# Patient Record
Sex: Male | Born: 1935 | Race: White | Hispanic: No | Smoking: Former smoker
Health system: Southern US, Community
[De-identification: ages and names within clinical notes are randomized; demographics above are authoritative.]

## PROBLEM LIST (undated history)

## (undated) DIAGNOSIS — I509 Heart failure, unspecified: Secondary | ICD-10-CM

## (undated) DIAGNOSIS — I10 Essential (primary) hypertension: Secondary | ICD-10-CM

## (undated) DIAGNOSIS — N400 Enlarged prostate without lower urinary tract symptoms: Secondary | ICD-10-CM

## (undated) DIAGNOSIS — I2510 Atherosclerotic heart disease of native coronary artery without angina pectoris: Secondary | ICD-10-CM

## (undated) DIAGNOSIS — I219 Acute myocardial infarction, unspecified: Secondary | ICD-10-CM

## (undated) HISTORY — DX: Heart failure, unspecified: I50.9

## (undated) HISTORY — DX: Atherosclerotic heart disease of native coronary artery without angina pectoris: I25.10

## (undated) HISTORY — PX: CARDIAC CATHETERIZATION: SHX172

## (undated) HISTORY — DX: Benign prostatic hyperplasia without lower urinary tract symptoms: N40.0

## (undated) HISTORY — DX: Acute myocardial infarction, unspecified: I21.9

## (undated) HISTORY — PX: VASECTOMY: SHX75

## (undated) HISTORY — DX: Essential (primary) hypertension: I10

---

## 2020-02-28 ENCOUNTER — Ambulatory Visit (INDEPENDENT_AMBULATORY_CARE_PROVIDER_SITE_OTHER): Payer: Medicare Other | Admitting: Cardiology

## 2020-02-28 ENCOUNTER — Other Ambulatory Visit: Payer: Self-pay

## 2020-02-28 ENCOUNTER — Encounter: Payer: Self-pay | Admitting: Cardiology

## 2020-02-28 VITALS — BP 150/70 | HR 61 | Ht 67.0 in | Wt 166.0 lb

## 2020-02-28 DIAGNOSIS — I2510 Atherosclerotic heart disease of native coronary artery without angina pectoris: Secondary | ICD-10-CM

## 2020-02-28 DIAGNOSIS — I10 Essential (primary) hypertension: Secondary | ICD-10-CM

## 2020-02-28 DIAGNOSIS — R072 Precordial pain: Secondary | ICD-10-CM

## 2020-02-28 NOTE — Patient Instructions (Signed)
Medication Instructions:  Your physician recommends that you continue on your current medications as directed. Please refer to the Current Medication list given to you today.  *If you need a refill on your cardiac medications before your next appointment, please call your pharmacy*   Lab Work: None Ordered If you have labs (blood work) drawn today and your tests are completely normal, you will receive your results only by: Marland Kitchen MyChart Message (if you have MyChart) OR . A paper copy in the mail If you have any lab test that is abnormal or we need to change your treatment, we will call you to review the results.   Testing/Procedures:  1.  Your physician has requested that you have an echocardiogram. Echocardiography is a painless test that uses sound waves to create images of your heart. It provides your doctor with information about the size and shape of your heart and how well your heart's chambers and valves are working. This procedure takes approximately one hour. There are no restrictions for this procedure.  2.  [facility] MYOVIEW       Your caregiver has ordered a Stress Test with nuclear imaging. The purpose of this test is to evaluate the blood supply to your heart muscle. This procedure is referred to as a "Non-Invasive Stress Test." This is because other than having an IV started in your vein, nothing is inserted or "invades" your body. Cardiac stress tests are done to find areas of poor blood flow to the heart by determining the extent of coronary artery disease (CAD). Some patients exercise on a treadmill, which naturally increases the blood flow to your heart, while others who are  unable to walk on a treadmill due to physical limitations have a pharmacologic/chemical stress agent called Lexiscan . This medicine will mimic walking on a treadmill by temporarily increasing your coronary blood flow.      PLEASE REPORT TO [facility] MEDICAL MALL ENTRANCE   THE VOLUNTEERS AT THE FIRST DESK WILL  DIRECT YOU WHERE TO GO     *Please note: these test may take anywhere between 2-4 hours to complete       Date of Procedure:_____________________________________   Arrival Time for Procedure:______________________________    PLEASE NOTIFY THE OFFICE AT LEAST 24 HOURS IN ADVANCE IF YOU ARE UNABLE TO KEEP YOUR APPOINTMENT.  [phone]  PLEASE NOTIFY NUCLEAR MEDICINE AT [facility] AT LEAST 24 HOURS IN ADVANCE IF YOU ARE UNABLE TO KEEP YOUR APPOINTMENT. [phone]         How to prepare for your Myoview test:         __XX__:  Hold betablocker(s) night before procedure and morning of procedure:     metoprolol succinate (TOPROL-XL     1. Do not eat or drink after midnight  2. No caffeine for 24 hours prior to test  3. No smoking 24 hours prior to test.  4. Unless instructed otherwise, Take your medication with a small sips of water.          Follow-Up: At [facility], you and your health needs are our priority.  As part of our continuing mission to provide you with exceptional heart care, we have created designated Provider Care Teams.  These Care Teams include your primary Cardiologist (physician) and Advanced Practice Providers (APPs -  Physician Assistants and Nurse Practitioners) who all work together to provide you with the care you need, when you need it.  We recommend signing up for the patient portal called "MyChart".  Sign  up information is provided on this After Visit Summary.  MyChart is used to connect with patients for Virtual Visits (Telemedicine).  Patients are able to view lab/test results, encounter notes, upcoming appointments, etc.  Non-urgent messages can be sent to your provider as well.   To learn more about what you can do with MyChart, go to [email or web address].    Your next appointment:   Follow up after Myoview and Echo   The format for your next appointment:   In Person  Provider:   Debbe Odea, MD   Other Instructions

## 2020-02-28 NOTE — Progress Notes (Signed)
Cardiology Office Note:    Date:  02/28/2020   ID:  Douglas Vance, DOB October 08, 1934, MRN [id number]  PCP:  Almetta Lovely, [facility]  [facility] Cardiologist:  Debbe Odea, MD  [facility] Electrophysiologist:  None   Referring MD: Melonie Florida, FNP   Chief Complaint  Patient presents with  . NEW patient    Chest pain/CAD    History of Present Illness:    Douglas Vance is a 86 y.o. male with a hx of hypertension, MI/CAD (s/p PCI x 9) who presents due to chest pain.  States having a history of MI, first occurrence in 2000.  He was seen in [facility] where stent was placed.  Over the past several years, he has had multiple PCI's, totaling 9.  Last PCI was 3 years ago by former cardiologist.  He moved to the area to be close to daughter after he lost his wife in August 2021.  Over the past couple of weeks, he is noted nonexertional chest pain.  He states going through a lot with the move, losing a loved one, which may contribute to some of his symptoms.  Previously was on Plavix, this was stopped after patient developed nosebleeds.   Past Medical History:  Diagnosis Date  . BPH (benign prostatic hyperplasia)   . Coronary artery disease   . Hypertension   . Myocardial infarction [facility])     Past Surgical History:  Procedure Laterality Date  . CARDIAC CATHETERIZATION    . VASECTOMY      Current Medications: Current Meds  Medication Sig  . amLODipine (NORVASC) 5 MG tablet Take 5 mg by mouth daily.  Marland Kitchen aspirin EC 81 MG tablet Take 81 mg by mouth daily. Swallow whole.  . donepezil (ARICEPT) 5 MG tablet Take 5 mg by mouth at bedtime.  Marland Kitchen lisinopril (ZESTRIL) 10 MG tablet Take 10 mg by mouth daily.  Marland Kitchen lovastatin (MEVACOR) 20 MG tablet Take 20 mg by mouth at bedtime.  . metoprolol succinate (TOPROL-XL) 25 MG 24 hr tablet Take 25 mg by mouth daily.  Marland Kitchen omeprazole (PRILOSEC) 20 MG capsule Take 20 mg by mouth daily.  . tamsulosin (FLOMAX) 0.4 MG CAPS  capsule Take 0.4 mg by mouth daily.  Marland Kitchen VITAMIN D PO Take 2,000 Int'l Units/day by mouth daily.     Allergies:   Cipro [ciprofloxacin hcl]   Social History   Socioeconomic History  . Marital status: Widowed    Spouse name: Not on file  . Number of children: Not on file  . Years of education: Not on file  . Highest education level: Not on file  Occupational History  . Not on file  Tobacco Use  . Smoking status: Former Games developer  . Smokeless tobacco: Never Used  Vaping Use  . Vaping Use: Never used  Substance and Sexual Activity  . Alcohol use: Not Currently  . Drug use: Never  . Sexual activity: Not on file  Other Topics Concern  . Not on file  Social History Narrative  . Not on file   Social Determinants of Health   Financial Resource Strain: Not on file  Food Insecurity: Not on file  Transportation Needs: Not on file  Physical Activity: Not on file  Stress: Not on file  Social Connections: Not on file     Family History: The patient's family history is not on file.  ROS:   Please see the history of present illness.     All other systems reviewed and  are negative.  EKGs/Labs/Other Studies Reviewed:    The following studies were reviewed today:   EKG:  EKG is  ordered today.  The ekg ordered today demonstrates sinus rhythm, left bundle branch block.  Recent Labs: No results found for requested labs within last 8760 hours.  Recent Lipid Panel No results found for: CHOL, TRIG, HDL, CHOLHDL, VLDL, LDLCALC, LDLDIRECT   Risk Assessment/Calculations:     Physical Exam:    VS:  BP (!) 150/70 (BP Location: Right Arm, Patient Position: Sitting, Cuff Size: Normal)   Pulse 61   Ht 5\' 7"  (1.702 m)   Wt 166 lb (75.3 kg)   SpO2 93%   BMI 26.00 kg/m     Wt Readings from Last 3 Encounters:  02/28/20 166 lb (75.3 kg)     GEN:  Well nourished, well developed in no acute distress HEENT: Normal NECK: No JVD; No carotid bruits LYMPHATICS: No  lymphadenopathy CARDIAC: RRR, 2/6 systolic murmur RESPIRATORY:  Clear to auscultation without rales, wheezing or rhonchi  ABDOMEN: Soft, non-tender, non-distended MUSCULOSKELETAL:  No edema; No deformity  SKIN: Warm and dry NEUROLOGIC:  Alert and oriented x 3 PSYCHIATRIC:  Normal affect   ASSESSMENT:    1. Precordial pain   2. CAD in native artery   3. Primary hypertension    PLAN:    In order of problems listed above:  1. Patient with chest pain, history of CAD/PCI.  Get echocardiogram, get Lexiscan Myoview to evaluate presence of ischemia.  Consider Imdur for antianginal benefit if symptoms persist. 2. History of CAD/PCI.  Continue aspirin, lovastatin, Toprol-XL as prescribed. 3. Hypertension, Toprol-XL, lisinopril.  Follow-up after echo and Lexiscan Myoview.    Medication Adjustments/Labs and Tests Ordered: Current medicines are reviewed at length with the patient today.  Concerns regarding medicines are outlined above.  Orders Placed This Encounter  Procedures  . NM Myocar Multi W/Spect W/Wall Motion / EF  . EKG 12-Lead  . ECHOCARDIOGRAM COMPLETE   No orders of the defined types were placed in this encounter.   Patient Instructions   Medication Instructions:  Your physician recommends that you continue on your current medications as directed. Please refer to the Current Medication list given to you today.  *If you need a refill on your cardiac medications before your next appointment, please call your pharmacy*   Lab Work: None Ordered If you have labs (blood work) drawn today and your tests are completely normal, you will receive your results only by: 03/01/20 MyChart Message (if you have MyChart) OR . A paper copy in the mail If you have any lab test that is abnormal or we need to change your treatment, we will call you to review the results.   Testing/Procedures:  1.  Your physician has requested that you have an echocardiogram. Echocardiography is a painless  test that uses sound waves to create images of your heart. It provides your doctor with information about the size and shape of your heart and how well your heart's chambers and valves are working. This procedure takes approximately one hour. There are no restrictions for this procedure.  2.  [facility] MYOVIEW       Your caregiver has ordered a Stress Test with nuclear imaging. The purpose of this test is to evaluate the blood supply to your heart muscle. This procedure is referred to as a "Non-Invasive Stress Test." This is because other than having an IV started in your vein, nothing is inserted or "invades" your body.  Cardiac stress tests are done to find areas of poor blood flow to the heart by determining the extent of coronary artery disease (CAD). Some patients exercise on a treadmill, which naturally increases the blood flow to your heart, while others who are  unable to walk on a treadmill due to physical limitations have a pharmacologic/chemical stress agent called Lexiscan . This medicine will mimic walking on a treadmill by temporarily increasing your coronary blood flow.      PLEASE REPORT TO [facility] MEDICAL MALL ENTRANCE   THE VOLUNTEERS AT THE FIRST DESK WILL DIRECT YOU WHERE TO GO     *Please note: these test may take anywhere between 2-4 hours to complete       Date of Procedure:_____________________________________   Arrival Time for Procedure:______________________________    PLEASE NOTIFY THE OFFICE AT LEAST 24 HOURS IN ADVANCE IF YOU ARE UNABLE TO KEEP YOUR APPOINTMENT.  [phone]  PLEASE NOTIFY NUCLEAR MEDICINE AT [facility] AT LEAST 24 HOURS IN ADVANCE IF YOU ARE UNABLE TO KEEP YOUR APPOINTMENT. [phone]         How to prepare for your Myoview test:         __XX__:  Hold betablocker(s) night before procedure and morning of procedure:     metoprolol succinate (TOPROL-XL     1. Do not eat or drink after midnight  2. No caffeine for 24 hours prior to test  3. No  smoking 24 hours prior to test.  4. Unless instructed otherwise, Take your medication with a small sips of water.          Follow-Up: At [facility], you and your health needs are our priority.  As part of our continuing mission to provide you with exceptional heart care, we have created designated Provider Care Teams.  These Care Teams include your primary Cardiologist (physician) and Advanced Practice Providers (APPs -  Physician Assistants and Nurse Practitioners) who all work together to provide you with the care you need, when you need it.  We recommend signing up for the patient portal called "MyChart".  Sign up information is provided on this After Visit Summary.  MyChart is used to connect with patients for Virtual Visits (Telemedicine).  Patients are able to view lab/test results, encounter notes, upcoming appointments, etc.  Non-urgent messages can be sent to your provider as well.   To learn more about what you can do with MyChart, go to [email or web address].    Your next appointment:   Follow up after Myoview and Echo   The format for your next appointment:   In Person  Provider:   Debbe Odea, MD   Other Instructions      Signed, Debbe Odea, MD  02/28/2020 2:42 PM    [facility]

## 2020-03-13 ENCOUNTER — Other Ambulatory Visit: Payer: Self-pay

## 2020-03-13 ENCOUNTER — Ambulatory Visit (INDEPENDENT_AMBULATORY_CARE_PROVIDER_SITE_OTHER): Payer: Medicare Other

## 2020-03-13 DIAGNOSIS — R072 Precordial pain: Secondary | ICD-10-CM | POA: Diagnosis not present

## 2020-03-13 LAB — ECHOCARDIOGRAM COMPLETE
Area-P 1/2: 2.59 cm2
MV M vel: 4.87 m/s
MV Peak grad: 94.9 mmHg
P 1/2 time: 485 msec
S' Lateral: 4.8 cm
Single Plane A4C EF: 22.1 %

## 2020-03-17 ENCOUNTER — Other Ambulatory Visit: Payer: Self-pay

## 2020-03-17 ENCOUNTER — Encounter
Admission: RE | Admit: 2020-03-17 | Discharge: 2020-03-17 | Disposition: A | Discharge status: Home / Self Care | Payer: Medicare Other | Source: Ambulatory Visit | Attending: Cardiology | Admitting: Cardiology

## 2020-03-17 DIAGNOSIS — R072 Precordial pain: Secondary | ICD-10-CM | POA: Diagnosis present

## 2020-03-17 LAB — NM MYOCAR MULTI W/SPECT W/WALL MOTION / EF
Estimated workload: 1 METS
Exercise duration (min): 0 min
Exercise duration (sec): 0 s
LV dias vol: 224 mL (ref 62–150)
LV sys vol: 133 mL
MPHR: 137 {beats}/min
Peak HR: 74 {beats}/min
Percent HR: 54 %
Rest HR: 52 {beats}/min
SDS: 0
SRS: 8
SSS: 5
TID: 1.14

## 2020-03-17 MED ORDER — TECHNETIUM TC 99M TETROFOSMIN IV KIT
32.1800 | PACK | Freq: Once | INTRAVENOUS | Status: AC | PRN
  Administered 2020-03-17: 32.18 via INTRAVENOUS

## 2020-03-17 MED ORDER — REGADENOSON 0.4 MG/5ML IV SOLN
0.4000 mg | Freq: Once | INTRAVENOUS | Status: AC
  Administered 2020-03-17: 0.4 mg via INTRAVENOUS

## 2020-03-17 MED ORDER — TECHNETIUM TC 99M TETROFOSMIN IV KIT
10.0180 | PACK | Freq: Once | INTRAVENOUS | Status: AC | PRN
  Administered 2020-03-17: 10.018 via INTRAVENOUS

## 2020-03-18 ENCOUNTER — Telehealth: Payer: Self-pay

## 2020-03-18 MED ORDER — ISOSORBIDE MONONITRATE ER 30 MG PO TB24: 30.0000 mg | ORAL_TABLET | Freq: Every day | ORAL | 5 refills | Status: DC

## 2020-03-18 NOTE — Telephone Encounter (Signed)
Called patient and relayed the below result note from Dr. Azucena Cecil and the result note from his Echo Cardio gram as copied and pasted below:  Douglas Odea, MD  Gibson Ramp, RN Patient has moderately reduced ejection fraction/squeezing function of his heart. Continue medications as prescribed. Obtain stress test/Myoview as scheduled. Keep follow-up appointment.   Prescription for IMDUR sent to patients pharmacy. Patient verbalized understanding and confirmed that he would be here for his follow up on Monday 03/27/20 at 10:20.

## 2020-03-18 NOTE — Telephone Encounter (Signed)
-----   Message from Debbe Odea, MD sent at 03/18/2020  9:54 AM EST ----- No evidence of ischemia noted on stress test/Myoview.  Please start Imdur 30 mg daily to help with chest pain.  Keep follow-up appointment.  Thank you

## 2020-03-27 ENCOUNTER — Encounter: Payer: Self-pay | Admitting: Cardiology

## 2020-03-27 ENCOUNTER — Ambulatory Visit (INDEPENDENT_AMBULATORY_CARE_PROVIDER_SITE_OTHER): Payer: Medicare Other | Admitting: Cardiology

## 2020-03-27 ENCOUNTER — Other Ambulatory Visit: Payer: Self-pay

## 2020-03-27 VITALS — BP 118/56 | HR 50 | Ht 67.0 in | Wt 165.0 lb

## 2020-03-27 DIAGNOSIS — I5020 Unspecified systolic (congestive) heart failure: Secondary | ICD-10-CM | POA: Diagnosis not present

## 2020-03-27 DIAGNOSIS — I2510 Atherosclerotic heart disease of native coronary artery without angina pectoris: Secondary | ICD-10-CM | POA: Diagnosis not present

## 2020-03-27 DIAGNOSIS — R072 Precordial pain: Secondary | ICD-10-CM | POA: Diagnosis not present

## 2020-03-27 DIAGNOSIS — I10 Essential (primary) hypertension: Secondary | ICD-10-CM

## 2020-03-27 MED ORDER — METOPROLOL SUCCINATE ER 25 MG PO TB24: 12.5000 mg | ORAL_TABLET | Freq: Every day | ORAL | Status: DC

## 2020-03-27 NOTE — Progress Notes (Signed)
Cardiology Office Note:    Date:  03/27/2020   ID:  Douglas Vance, DOB August 18, 1934, MRN [id number]  PCP:  Housecalls, [facility]  [facility] Cardiologist:  Debbe Odea, MD  [facility] Electrophysiologist:  None   Referring MD: [facility], [facility]*   Chief Complaint  Patient presents with  . Follow-up    After testing--echo and stress test    History of Present Illness:    Douglas Vance is a 86 y.o. male with a hx of hypertension, MI/CAD (s/p PCI x 9) who presents for follow-up.  Patient last seen due to chest pain.  Echocardiogram and Lexiscan Myoview were ordered to evaluate patient's symptoms.  He was started on Imdur 30 mg daily to help with chest pain.  He states symptoms of chest discomfort has improved a little bit.  Also notes unsteadiness in his feet.  Patient presents today with daughter, who states patient has a history of GI bleeds, EGD and colonoscopy only revealed gastritis.  Has a history of significant nosebleeds, causing patient to stop Plavix.  Prior notes States having a history of MI, first occurrence in 2000.  He was seen in [facility] where stent was placed.  Over the past several years, he has had multiple PCI's, totaling 9.  Last PCI was 3 years ago by former cardiologist.  He moved to the area to be close to daughter after he lost his wife in August 2021.  Over the past couple of weeks, he is noted nonexertional chest pain.  He states going through a lot with the move, losing a loved one, which may contribute to some of his symptoms.   Past Medical History:  Diagnosis Date  . BPH (benign prostatic hyperplasia)   . Coronary artery disease   . Hypertension   . Myocardial infarction [facility])     Past Surgical History:  Procedure Laterality Date  . CARDIAC CATHETERIZATION    . VASECTOMY      Current Medications: Current Meds  Medication Sig  . aspirin EC 81 MG tablet Take 81 mg by mouth daily. Swallow whole.  .  donepezil (ARICEPT) 5 MG tablet Take 5 mg by mouth at bedtime.  . isosorbide mononitrate (IMDUR) 30 MG 24 hr tablet Take 1 tablet (30 mg total) by mouth daily.  Marland Kitchen lisinopril (ZESTRIL) 10 MG tablet Take 10 mg by mouth daily.  Marland Kitchen lovastatin (MEVACOR) 20 MG tablet Take 20 mg by mouth at bedtime.  Marland Kitchen omeprazole (PRILOSEC) 20 MG capsule Take 20 mg by mouth daily.  . tamsulosin (FLOMAX) 0.4 MG CAPS capsule Take 0.4 mg by mouth daily.  Marland Kitchen VITAMIN D PO Take 2,000 Int'l Units/day by mouth daily.  . [DISCONTINUED] amLODipine (NORVASC) 5 MG tablet Take 5 mg by mouth daily.  . [DISCONTINUED] metoprolol succinate (TOPROL-XL) 25 MG 24 hr tablet Take 25 mg by mouth daily.     Allergies:   Cipro [ciprofloxacin hcl]   Social History   Socioeconomic History  . Marital status: Widowed    Spouse name: Not on file  . Number of children: Not on file  . Years of education: Not on file  . Highest education level: Not on file  Occupational History  . Not on file  Tobacco Use  . Smoking status: Former Games developer  . Smokeless tobacco: Never Used  Vaping Use  . Vaping Use: Never used  Substance and Sexual Activity  . Alcohol use: Not Currently  . Drug use: Never  . Sexual activity: Not on  file  Other Topics Concern  . Not on file  Social History Narrative  . Not on file   Social Determinants of Health   Financial Resource Strain: Not on file  Food Insecurity: Not on file  Transportation Needs: Not on file  Physical Activity: Not on file  Stress: Not on file  Social Connections: Not on file     Family History: The patient's family history is not on file.  ROS:   Please see the history of present illness.     All other systems reviewed and are negative.  EKGs/Labs/Other Studies Reviewed:    The following studies were reviewed today:   EKG:  EKG is  ordered today.  The ekg ordered today demonstrates sinus rhythm, left bundle branch block.  Recent Labs: No results found for requested labs  within last 8760 hours.  Recent Lipid Panel No results found for: CHOL, TRIG, HDL, CHOLHDL, VLDL, LDLCALC, LDLDIRECT   Risk Assessment/Calculations:     Physical Exam:    VS:  BP (!) 118/56   Pulse (!) 50   Ht 5\' 7"  (1.702 m)   Wt 165 lb (74.8 kg)   BMI 25.84 kg/m     Wt Readings from Last 3 Encounters:  03/27/20 165 lb (74.8 kg)  02/28/20 166 lb (75.3 kg)     GEN:  Well nourished, well developed in no acute distress HEENT: Normal NECK: No JVD; No carotid bruits LYMPHATICS: No lymphadenopathy CARDIAC: RRR, 2/6 systolic murmur RESPIRATORY:  Clear to auscultation without rales, wheezing or rhonchi  ABDOMEN: Soft, non-tender, non-distended MUSCULOSKELETAL:  No edema; No deformity  SKIN: Warm and dry NEUROLOGIC:  Alert and oriented x 3 PSYCHIATRIC:  Normal affect   ASSESSMENT:    1. Precordial pain   2. CAD in native artery   3. Primary hypertension   4. HFrEF (heart failure with reduced ejection fraction) (HCC)    PLAN:    In order of problems listed above:  1. Patient with chest pain, history of CAD/PCI.  Echocardiogram shows moderately reduced systolic function, EF 30 to 35%.  Aortic valve sclerosis present.  Lexiscan Myoview with no evidence for ischemia.  Symptoms improved with Imdur.  Continue Imdur 30 mg daily. 2. History of CAD/PCI x 9.  Prior records from previous cardiologist requested not obtained.  Discussed with patient and daughter in details.  Due to patient being intolerant on DAPT, will not pursue left heart cath as we will not be able to intervene if any significant stenosis is found.  Continue aspirin, lovastatin, Imdur.  As prescribed. 3. Hypertension, BP low, also bradycardic, unsteady gait.  Stop Norvasc, decrease Toprol-XL to 12.5 mg daily.  Consider decreasing lisinopril if BP remains low. 4. HFrEF, likely ischemic in origin.  Toprol-XL, lisinopril.  Patient is euvolemic.  Follow-up in 6 weeks   Medication Adjustments/Labs and Tests  Ordered: Current medicines are reviewed at length with the patient today.  Concerns regarding medicines are outlined above.  No orders of the defined types were placed in this encounter.  Meds ordered this encounter  Medications  . metoprolol succinate (TOPROL-XL) 25 MG 24 hr tablet    Sig: Take 0.5 tablets (12.5 mg total) by mouth daily.    Patient Instructions  Medication Instructions:   Your physician has recommended you make the following change in your medication:   1.  STOP taking your Norvasc (Amlodipine).  2.  DECREASE your Toprol XL (Metoprolol Succinate) to 12.5 MG daily. This will be half your your  25 MG tablet.    Lab Work: None ordered If you have labs (blood work) drawn today and your tests are completely normal, you will receive your results only by: Marland Kitchen MyChart Message (if you have MyChart) OR . A paper copy in the mail If you have any lab test that is abnormal or we need to change your treatment, we will call you to review the results.   Testing/Procedures: None ordered   Follow-Up: At [facility], you and your health needs are our priority.  As part of our continuing mission to provide you with exceptional heart care, we have created designated Provider Care Teams.  These Care Teams include your primary Cardiologist (physician) and Advanced Practice Providers (APPs -  Physician Assistants and Nurse Practitioners) who all work together to provide you with the care you need, when you need it.  We recommend signing up for the patient portal called "MyChart".  Sign up information is provided on this After Visit Summary.  MyChart is used to connect with patients for Virtual Visits (Telemedicine).  Patients are able to view lab/test results, encounter notes, upcoming appointments, etc.  Non-urgent messages can be sent to your provider as well.   To learn more about what you can do with MyChart, go to [email or web address].    Your next appointment:   6  week(s)  The format for your next appointment:   In Person  Provider:   Debbe Odea, MD ONLY   Other Instructions      Signed, Debbe Odea, MD  03/27/2020 12:26 PM     [facility]

## 2020-03-27 NOTE — Patient Instructions (Signed)
Medication Instructions:   Your physician has recommended you make the following change in your medication:   1.  STOP taking your Norvasc (Amlodipine).  2.  DECREASE your Toprol XL (Metoprolol Succinate) to 12.5 MG daily. This will be half your your 25 MG tablet.    Lab Work: None ordered If you have labs (blood work) drawn today and your tests are completely normal, you will receive your results only by: Marland Kitchen MyChart Message (if you have MyChart) OR . A paper copy in the mail If you have any lab test that is abnormal or we need to change your treatment, we will call you to review the results.   Testing/Procedures: None ordered   Follow-Up: At [facility], you and your health needs are our priority.  As part of our continuing mission to provide you with exceptional heart care, we have created designated Provider Care Teams.  These Care Teams include your primary Cardiologist (physician) and Advanced Practice Providers (APPs -  Physician Assistants and Nurse Practitioners) who all work together to provide you with the care you need, when you need it.  We recommend signing up for the patient portal called "MyChart".  Sign up information is provided on this After Visit Summary.  MyChart is used to connect with patients for Virtual Visits (Telemedicine).  Patients are able to view lab/test results, encounter notes, upcoming appointments, etc.  Non-urgent messages can be sent to your provider as well.   To learn more about what you can do with MyChart, go to [email or web address].    Your next appointment:   6 week(s)  The format for your next appointment:   In Person  Provider:   Debbe Odea, MD ONLY   Other Instructions

## 2020-04-04 ENCOUNTER — Encounter: Payer: Self-pay | Admitting: Cardiology

## 2020-04-07 ENCOUNTER — Telehealth: Payer: Self-pay | Admitting: Cardiology

## 2020-04-07 NOTE — Telephone Encounter (Signed)
Pt c/o medication issue:  1. Name of Medication: lisinopril    2. How are you currently taking this medication (dosage and times per day)? 10 mg po q d   3. Are you having a reaction (difficulty breathing--STAT)?  Staggers when standing   4. What is your medication issue?  Previously discussed with [facility] at last visit patient metoprolol was decreased patient continues to stagger when standing and home health nurse told daughter patient has orthostatic hypotension .  Sitting bp 118/50 and standing 100/50 .    Patient daughter wants possible med change addressed asap as she is ordering patient blister packs from [facility] soon .

## 2020-04-09 NOTE — Telephone Encounter (Signed)
Patient's daughter, Gavin Pound, is calling (who is NOT on DPR) to discuss medication change(LIsinopril). Please call patient.

## 2020-04-09 NOTE — Telephone Encounter (Signed)
Spoke with patients daughter and reviewed she is not listed on release form and that we are still pending provider review how to proceed with lisinopril. Advised we would call him with this information once provider has recommendations. She verbalized understanding with no further questions at this time.

## 2020-04-10 NOTE — Telephone Encounter (Signed)
Spoke with patient and reviewed that provider said he could hold the lisinopril since his blood pressures have been running low and he has had problems with ambulation. He verbalized understanding with no further questions at this time.

## 2020-05-12 ENCOUNTER — Encounter: Payer: Self-pay | Admitting: Cardiology

## 2020-05-12 ENCOUNTER — Ambulatory Visit (INDEPENDENT_AMBULATORY_CARE_PROVIDER_SITE_OTHER): Payer: Medicare Other | Admitting: Cardiology

## 2020-05-12 ENCOUNTER — Other Ambulatory Visit: Payer: Self-pay

## 2020-05-12 VITALS — BP 114/50 | HR 59 | Ht 67.0 in | Wt 165.0 lb

## 2020-05-12 DIAGNOSIS — I10 Essential (primary) hypertension: Secondary | ICD-10-CM

## 2020-05-12 DIAGNOSIS — I2510 Atherosclerotic heart disease of native coronary artery without angina pectoris: Secondary | ICD-10-CM | POA: Diagnosis not present

## 2020-05-12 DIAGNOSIS — I5020 Unspecified systolic (congestive) heart failure: Secondary | ICD-10-CM

## 2020-05-12 NOTE — Patient Instructions (Signed)
Medication Instructions:   STOP Lisinopril  *If you need a refill on your cardiac medications before your next appointment, please call your pharmacy*   Follow-Up: At [facility], you and your health needs are our priority.  As part of our continuing mission to provide you with exceptional heart care, we have created designated Provider Care Teams.  These Care Teams include your primary Cardiologist (physician) and Advanced Practice Providers (APPs -  Physician Assistants and Nurse Practitioners) who all work together to provide you with the care you need, when you need it.  We recommend signing up for the patient portal called "MyChart".  Sign up information is provided on this After Visit Summary.  MyChart is used to connect with patients for Virtual Visits (Telemedicine).  Patients are able to view lab/test results, encounter notes, upcoming appointments, etc.  Non-urgent messages can be sent to your provider as well.   To learn more about what you can do with MyChart, go to [email or web address].    Your next appointment:   6 week(s)  The format for your next appointment:   In Person  Provider:   You may see Debbe Odea, MD or one of the following Advanced Practice Providers on your designated Care Team:    Nicolasa Ducking, NP  Eula Listen, PA-C  Marisue Ivan, PA-C  Cadence [place], [place]  Gillian Shields, NP

## 2020-05-12 NOTE — Progress Notes (Signed)
Cardiology Office Note:    Date:  05/12/2020   ID:  Douglas Vance, DOB [birth year], MRN [id number]  PCP:  [facility]  [facility] Cardiologist:  Debbe Odea, MD  [facility] Electrophysiologist:  None   Referring MD: [facility]*   Chief Complaint  Patient presents with  . Other    6 week follow up. Patient c/o still getting dizzy when moving from the sitting to standing position. Med reviewed verbally with patient.     History of Present Illness:    Douglas Vance is a 86 y.o. male with a hx of hypertension, MI/CAD (s/p PCI x 9) who presents for follow-up.  He was last seen for chest pain and low blood pressures.  He was started on Imdur with good effect, chest pain is much improved.  His blood pressures have been low, Norvasc was stopped after last visit.  He still states feeling dizzy when moving from sitting to standing position.  Currently takes lisinopril 10 mg daily, Toprol-XL 12.5 mg daily, Imdur 30 mg daily.   Prior notes States having a history of MI, first occurrence in 2000.  He was seen in [facility] where stent was placed.  Over the past several years, he has had multiple PCI's, totaling 9.  Last PCI was 3 years ago by former cardiologist.  He moved to the area to be close to daughter after he lost his wife in August 2021.  patient has a history of GI bleeds, EGD and colonoscopy only revealed gastritis.  Has a history of significant nosebleeds, causing patient to stop Plavix. .   Past Medical History:  Diagnosis Date  . BPH (benign prostatic hyperplasia)   . Coronary artery disease   . Hypertension   . Myocardial infarction [facility])     Past Surgical History:  Procedure Laterality Date  . CARDIAC CATHETERIZATION    . VASECTOMY      Current Medications: Current Meds  Medication Sig  . aspirin EC 81 MG tablet Take 81 mg by mouth daily. Swallow whole.  . donepezil (ARICEPT) 5 MG tablet Take 5 mg by mouth  at bedtime.  . isosorbide mononitrate (IMDUR) 30 MG 24 hr tablet Take 1 tablet (30 mg total) by mouth daily.  Marland Kitchen levothyroxine (SYNTHROID) 25 MCG tablet Take 25 mcg by mouth every morning.  . lovastatin (MEVACOR) 20 MG tablet Take 20 mg by mouth at bedtime.  . metoprolol succinate (TOPROL-XL) 25 MG 24 hr tablet Take 0.5 tablets (12.5 mg total) by mouth daily.  Marland Kitchen omeprazole (PRILOSEC) 20 MG capsule Take 20 mg by mouth daily.  . sertraline (ZOLOFT) 25 MG tablet Take 25 mg by mouth daily.  . tamsulosin (FLOMAX) 0.4 MG CAPS capsule Take 0.4 mg by mouth daily.  . traZODone (DESYREL) 50 MG tablet Take 25 mg by mouth at bedtime.  Marland Kitchen VITAMIN D PO Take 2,000 Int'l Units/day by mouth daily.     Allergies:   Cipro [ciprofloxacin hcl]   Social History   Socioeconomic History  . Marital status: Widowed    Spouse name: Not on file  . Number of children: Not on file  . Years of education: Not on file  . Highest education level: Not on file  Occupational History  . Not on file  Tobacco Use  . Smoking status: Former Games developer  . Smokeless tobacco: Never Used  Vaping Use  . Vaping Use: Never used  Substance and Sexual Activity  . Alcohol use: Not Currently  .  Drug use: Never  . Sexual activity: Not on file  Other Topics Concern  . Not on file  Social History Narrative  . Not on file   Social Determinants of Health   Financial Resource Strain: Not on file  Food Insecurity: Not on file  Transportation Needs: Not on file  Physical Activity: Not on file  Stress: Not on file  Social Connections: Not on file     Family History: The patient's family history is not on file.  ROS:   Please see the history of present illness.     All other systems reviewed and are negative.  EKGs/Labs/Other Studies Reviewed:    The following studies were reviewed today:   EKG:  EKG id ordered today.  EKG shows sinus bradycardia, left bundle branch block, first-degree AV block.  Recent Labs: No results  found for requested labs within last 8760 hours.  Recent Lipid Panel No results found for: CHOL, TRIG, HDL, CHOLHDL, VLDL, LDLCALC, LDLDIRECT   Risk Assessment/Calculations:     Physical Exam:    VS:  BP (!) 114/50 (BP Location: Left Arm, Patient Position: Sitting, Cuff Size: Normal)   Pulse (!) 59   Ht 5\' 7"  (1.702 m)   Wt 165 lb (74.8 kg)   SpO2 96%   BMI 25.84 kg/m     Wt Readings from Last 3 Encounters:  05/12/20 165 lb (74.8 kg)  03/27/20 165 lb (74.8 kg)  02/28/20 166 lb (75.3 kg)     GEN:  Well nourished, well developed in no acute distress HEENT: Normal NECK: No JVD; No carotid bruits LYMPHATICS: No lymphadenopathy CARDIAC: RRR, 2/6 systolic murmur RESPIRATORY:  Clear to auscultation without rales, wheezing or rhonchi  ABDOMEN: Soft, non-tender, non-distended MUSCULOSKELETAL:  No edema; No deformity  SKIN: Warm and dry NEUROLOGIC:  Alert and oriented x 3 PSYCHIATRIC:  Normal affect   ASSESSMENT:    1. CAD in native artery   2. Primary hypertension   3. HFrEF (heart failure with reduced ejection fraction) (HCC)    PLAN:    In order of problems listed above:  1. History of CAD/PCI x 9.  Prior records from previous cardiologist requested but not obtained.  Previous intolerance to DAPT. repeat Left heart cath not obtained. Continue aspirin, lovastatin, Imdur. 2. Hypertension, BP low, also bradycardic, unsteady gait.  Stop lisinopril, continue Toprol and Imdur for antianginal benefit.  If BP remains low, plan to stop Toprol-XL, and consider decreasing even though. 3. HFrEF, likely ischemic in origin.  Low blood pressures preventing CHF medication use.  He is euvolemic.  Follow-up in 6 weeks   Medication Adjustments/Labs and Tests Ordered: Current medicines are reviewed at length with the patient today.  Concerns regarding medicines are outlined above.  Orders Placed This Encounter  Procedures  . EKG 12-Lead   No orders of the defined types were placed in  this encounter.   Patient Instructions  Medication Instructions:   STOP Lisinopril  *If you need a refill on your cardiac medications before your next appointment, please call your pharmacy*   Follow-Up: At [facility], you and your health needs are our priority.  As part of our continuing mission to provide you with exceptional heart care, we have created designated Provider Care Teams.  These Care Teams include your primary Cardiologist (physician) and Advanced Practice Providers (APPs -  Physician Assistants and Nurse Practitioners) who all work together to provide you with the care you need, when you need it.  We recommend  signing up for the patient portal called "MyChart".  Sign up information is provided on this After Visit Summary.  MyChart is used to connect with patients for Virtual Visits (Telemedicine).  Patients are able to view lab/test results, encounter notes, upcoming appointments, etc.  Non-urgent messages can be sent to your provider as well.   To learn more about what you can do with MyChart, go to [email or web address].    Your next appointment:   6 week(s)  The format for your next appointment:   In Person  Provider:   You may see Debbe Odea, MD or one of the following Advanced Practice Providers on your designated Care Team:    Nicolasa Ducking, NP  Eula Listen, PA-C  Marisue Ivan, PA-C  Cadence [place], [place]  Gillian Shields, NP      Signed, Debbe Odea, MD  05/12/2020 12:28 PM    [facility]

## 2020-06-06 ENCOUNTER — Inpatient Hospital Stay: Payer: Medicare Other

## 2020-06-06 ENCOUNTER — Emergency Department: Payer: Medicare Other

## 2020-06-06 ENCOUNTER — Inpatient Hospital Stay
Admission: EM | Admit: 2020-06-06 | Discharge: 2020-06-11 | DRG: 177 | Disposition: A | Discharge status: Home Health | Payer: Medicare Other | Attending: Internal Medicine | Admitting: Internal Medicine

## 2020-06-06 DIAGNOSIS — I2510 Atherosclerotic heart disease of native coronary artery without angina pectoris: Secondary | ICD-10-CM

## 2020-06-06 DIAGNOSIS — Z79890 Hormone replacement therapy (postmenopausal): Secondary | ICD-10-CM | POA: Diagnosis not present

## 2020-06-06 DIAGNOSIS — J690 Pneumonitis due to inhalation of food and vomit: Secondary | ICD-10-CM | POA: Diagnosis present

## 2020-06-06 DIAGNOSIS — J440 Chronic obstructive pulmonary disease with acute lower respiratory infection: Secondary | ICD-10-CM | POA: Diagnosis present

## 2020-06-06 DIAGNOSIS — I255 Ischemic cardiomyopathy: Secondary | ICD-10-CM | POA: Diagnosis present

## 2020-06-06 DIAGNOSIS — J9601 Acute respiratory failure with hypoxia: Secondary | ICD-10-CM | POA: Diagnosis present

## 2020-06-06 DIAGNOSIS — J432 Centrilobular emphysema: Secondary | ICD-10-CM | POA: Diagnosis not present

## 2020-06-06 DIAGNOSIS — Z79899 Other long term (current) drug therapy: Secondary | ICD-10-CM

## 2020-06-06 DIAGNOSIS — E440 Moderate protein-calorie malnutrition: Secondary | ICD-10-CM | POA: Insufficient documentation

## 2020-06-06 DIAGNOSIS — J9602 Acute respiratory failure with hypercapnia: Secondary | ICD-10-CM | POA: Diagnosis present

## 2020-06-06 DIAGNOSIS — I248 Other forms of acute ischemic heart disease: Secondary | ICD-10-CM | POA: Diagnosis present

## 2020-06-06 DIAGNOSIS — R682 Dry mouth, unspecified: Secondary | ICD-10-CM | POA: Diagnosis present

## 2020-06-06 DIAGNOSIS — Z66 Do not resuscitate: Secondary | ICD-10-CM | POA: Diagnosis present

## 2020-06-06 DIAGNOSIS — N400 Enlarged prostate without lower urinary tract symptoms: Secondary | ICD-10-CM | POA: Diagnosis present

## 2020-06-06 DIAGNOSIS — Z9861 Coronary angioplasty status: Secondary | ICD-10-CM

## 2020-06-06 DIAGNOSIS — E785 Hyperlipidemia, unspecified: Secondary | ICD-10-CM | POA: Diagnosis present

## 2020-06-06 DIAGNOSIS — I509 Heart failure, unspecified: Secondary | ICD-10-CM | POA: Diagnosis not present

## 2020-06-06 DIAGNOSIS — Z87891 Personal history of nicotine dependence: Secondary | ICD-10-CM

## 2020-06-06 DIAGNOSIS — I25118 Atherosclerotic heart disease of native coronary artery with other forms of angina pectoris: Secondary | ICD-10-CM | POA: Diagnosis present

## 2020-06-06 DIAGNOSIS — U071 COVID-19: Secondary | ICD-10-CM | POA: Diagnosis present

## 2020-06-06 DIAGNOSIS — I110 Hypertensive heart disease with heart failure: Secondary | ICD-10-CM | POA: Diagnosis present

## 2020-06-06 DIAGNOSIS — I5023 Acute on chronic systolic (congestive) heart failure: Secondary | ICD-10-CM | POA: Diagnosis present

## 2020-06-06 DIAGNOSIS — R778 Other specified abnormalities of plasma proteins: Secondary | ICD-10-CM | POA: Diagnosis not present

## 2020-06-06 DIAGNOSIS — E872 Acidosis, unspecified: Secondary | ICD-10-CM

## 2020-06-06 DIAGNOSIS — J1282 Pneumonia due to coronavirus disease 2019: Secondary | ICD-10-CM | POA: Diagnosis present

## 2020-06-06 DIAGNOSIS — Z8719 Personal history of other diseases of the digestive system: Secondary | ICD-10-CM

## 2020-06-06 DIAGNOSIS — I10 Essential (primary) hypertension: Secondary | ICD-10-CM

## 2020-06-06 DIAGNOSIS — R222 Localized swelling, mass and lump, trunk: Secondary | ICD-10-CM

## 2020-06-06 DIAGNOSIS — I252 Old myocardial infarction: Secondary | ICD-10-CM

## 2020-06-06 DIAGNOSIS — Z23 Encounter for immunization: Secondary | ICD-10-CM | POA: Diagnosis present

## 2020-06-06 DIAGNOSIS — Z888 Allergy status to other drugs, medicaments and biological substances status: Secondary | ICD-10-CM

## 2020-06-06 DIAGNOSIS — E876 Hypokalemia: Secondary | ICD-10-CM | POA: Diagnosis present

## 2020-06-06 DIAGNOSIS — Z7982 Long term (current) use of aspirin: Secondary | ICD-10-CM | POA: Diagnosis not present

## 2020-06-06 DIAGNOSIS — R7989 Other specified abnormal findings of blood chemistry: Secondary | ICD-10-CM

## 2020-06-06 LAB — URINALYSIS, COMPLETE (UACMP) WITH MICROSCOPIC
Bacteria, UA: NONE SEEN
Bilirubin Urine: NEGATIVE
Glucose, UA: NEGATIVE mg/dL
Ketones, ur: NEGATIVE mg/dL
Leukocytes,Ua: NEGATIVE
Nitrite: NEGATIVE
Protein, ur: NEGATIVE mg/dL
Specific Gravity, Urine: 1.008 (ref 1.005–1.030)
pH: 5 (ref 5.0–8.0)

## 2020-06-06 LAB — CBC WITH DIFFERENTIAL/PLATELET
Abs Immature Granulocytes: 0.06 10*3/uL (ref 0.00–0.07)
Basophils Absolute: 0.1 10*3/uL (ref 0.0–0.1)
Basophils Relative: 1 %
Eosinophils Absolute: 0.5 10*3/uL (ref 0.0–0.5)
Eosinophils Relative: 5 %
HCT: 46.8 % (ref 39.0–52.0)
Hemoglobin: 15.2 g/dL (ref 13.0–17.0)
Immature Granulocytes: 1 %
Lymphocytes Relative: 30 %
Lymphs Abs: 3.6 10*3/uL (ref 0.7–4.0)
MCH: 30.3 pg (ref 26.0–34.0)
MCHC: 32.5 g/dL (ref 30.0–36.0)
MCV: 93.4 fL (ref 80.0–100.0)
Monocytes Absolute: 1 10*3/uL (ref 0.1–1.0)
Monocytes Relative: 8 %
Neutro Abs: 6.6 10*3/uL (ref 1.7–7.7)
Neutrophils Relative %: 55 %
Platelets: 240 10*3/uL (ref 150–400)
RBC: 5.01 MIL/uL (ref 4.22–5.81)
RDW: 12.7 % (ref 11.5–15.5)
WBC: 11.8 10*3/uL — ABNORMAL HIGH (ref 4.0–10.5)
nRBC: 0 % (ref 0.0–0.2)

## 2020-06-06 LAB — TROPONIN I (HIGH SENSITIVITY)
Troponin I (High Sensitivity): 19 ng/L — ABNORMAL HIGH (ref <18)
Troponin I (High Sensitivity): 105 ng/L (ref <18)
Troponin I (High Sensitivity): 238 ng/L (ref <18)
Troponin I (High Sensitivity): 258 ng/L (ref <18)

## 2020-06-06 LAB — COMPREHENSIVE METABOLIC PANEL
ALT: 12 U/L (ref 0–44)
AST: 16 U/L (ref 15–41)
Albumin: 4.1 g/dL (ref 3.5–5.0)
Alkaline Phosphatase: 71 U/L (ref 38–126)
Anion gap: 7 (ref 5–15)
BUN: 11 mg/dL (ref 8–23)
CO2: 26 mmol/L (ref 22–32)
Calcium: 8.9 mg/dL (ref 8.9–10.3)
Chloride: 106 mmol/L (ref 98–111)
Creatinine, Ser: 1 mg/dL (ref 0.61–1.24)
GFR, Estimated: >60 mL/min (ref >60)
Glucose, Bld: 132 mg/dL — ABNORMAL HIGH (ref 70–99)
Potassium: 3.6 mmol/L (ref 3.5–5.1)
Sodium: 139 mmol/L (ref 135–145)
Total Bilirubin: 0.8 mg/dL (ref 0.3–1.2)
Total Protein: 8.1 g/dL (ref 6.5–8.1)

## 2020-06-06 LAB — BLOOD GAS, VENOUS
Acid-base deficit: 1.1 mmol/L (ref 0.0–2.0)
Bicarbonate: 29.8 mmol/L — ABNORMAL HIGH (ref 20.0–28.0)
FIO2: 100
O2 Saturation: 53.5 %
Patient temperature: 37
pCO2, Ven: 78 mmHg (ref 44.0–60.0)
pH, Ven: 7.19 — CL (ref 7.250–7.430)
pO2, Ven: 36 mmHg (ref 32.0–45.0)

## 2020-06-06 LAB — PROCALCITONIN: Procalcitonin: <0.1 ng/mL

## 2020-06-06 LAB — MRSA PCR SCREENING: MRSA by PCR: NEGATIVE

## 2020-06-06 LAB — LIPID PANEL
Cholesterol: 130 mg/dL (ref 0–200)
HDL: 56 mg/dL (ref >40)
LDL Cholesterol: 63 mg/dL (ref 0–99)
Total CHOL/HDL Ratio: 2.3 RATIO
Triglycerides: 54 mg/dL (ref <150)
VLDL: 11 mg/dL (ref 0–40)

## 2020-06-06 LAB — PROTIME-INR
INR: 1 (ref 0.8–1.2)
Prothrombin Time: 12.7 seconds (ref 11.4–15.2)

## 2020-06-06 LAB — RESP PANEL BY RT-PCR (FLU A&B, COVID) ARPGX2
Influenza A by PCR: NEGATIVE
Influenza B by PCR: NEGATIVE
SARS Coronavirus 2 by RT PCR: POSITIVE — AB

## 2020-06-06 LAB — D-DIMER, QUANTITATIVE: D-Dimer, Quant: 2.1 ug/mL-FEU — ABNORMAL HIGH (ref 0.00–0.50)

## 2020-06-06 LAB — LACTIC ACID, PLASMA
Lactic Acid, Venous: 2.4 mmol/L (ref 0.5–1.9)
Lactic Acid, Venous: 1.5 mmol/L (ref 0.5–1.9)

## 2020-06-06 LAB — FERRITIN: Ferritin: 29 ng/mL (ref 24–336)

## 2020-06-06 LAB — C-REACTIVE PROTEIN: CRP: 0.6 mg/dL (ref <1.0)

## 2020-06-06 LAB — APTT: aPTT: 43 seconds — ABNORMAL HIGH (ref 24–36)

## 2020-06-06 LAB — BRAIN NATRIURETIC PEPTIDE: B Natriuretic Peptide: 1222.8 pg/mL — ABNORMAL HIGH (ref 0.0–100.0)

## 2020-06-06 IMAGING — DX DG CHEST 1V PORT
2 series · 2 of 2 positions shown · non-contrast
Comparison: None.

CLINICAL DATA: Shortness of breath and decreased oxygenation. Two
week history of shortness of breath worsening tonight. Vomiting
tonight.

EXAM:
PORTABLE CHEST 1 VIEW

[chest ap (1 of 2)]
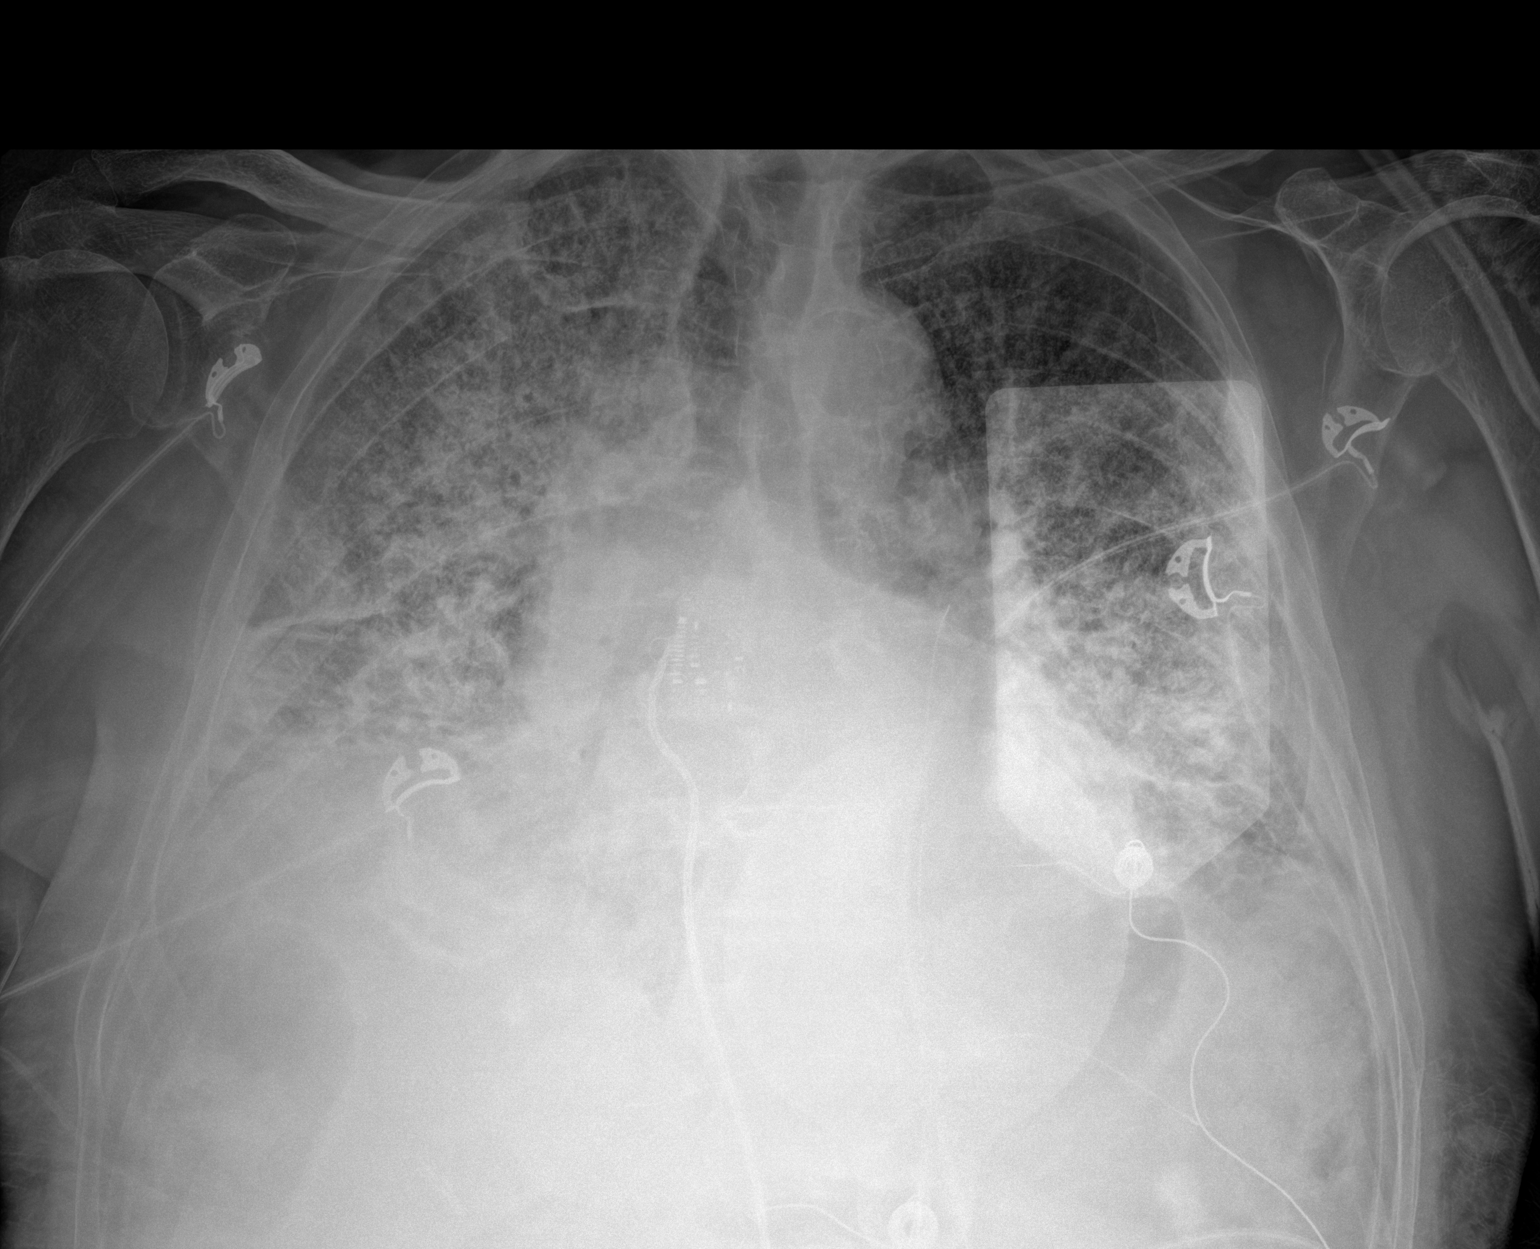

[chest ap (2 of 2)]
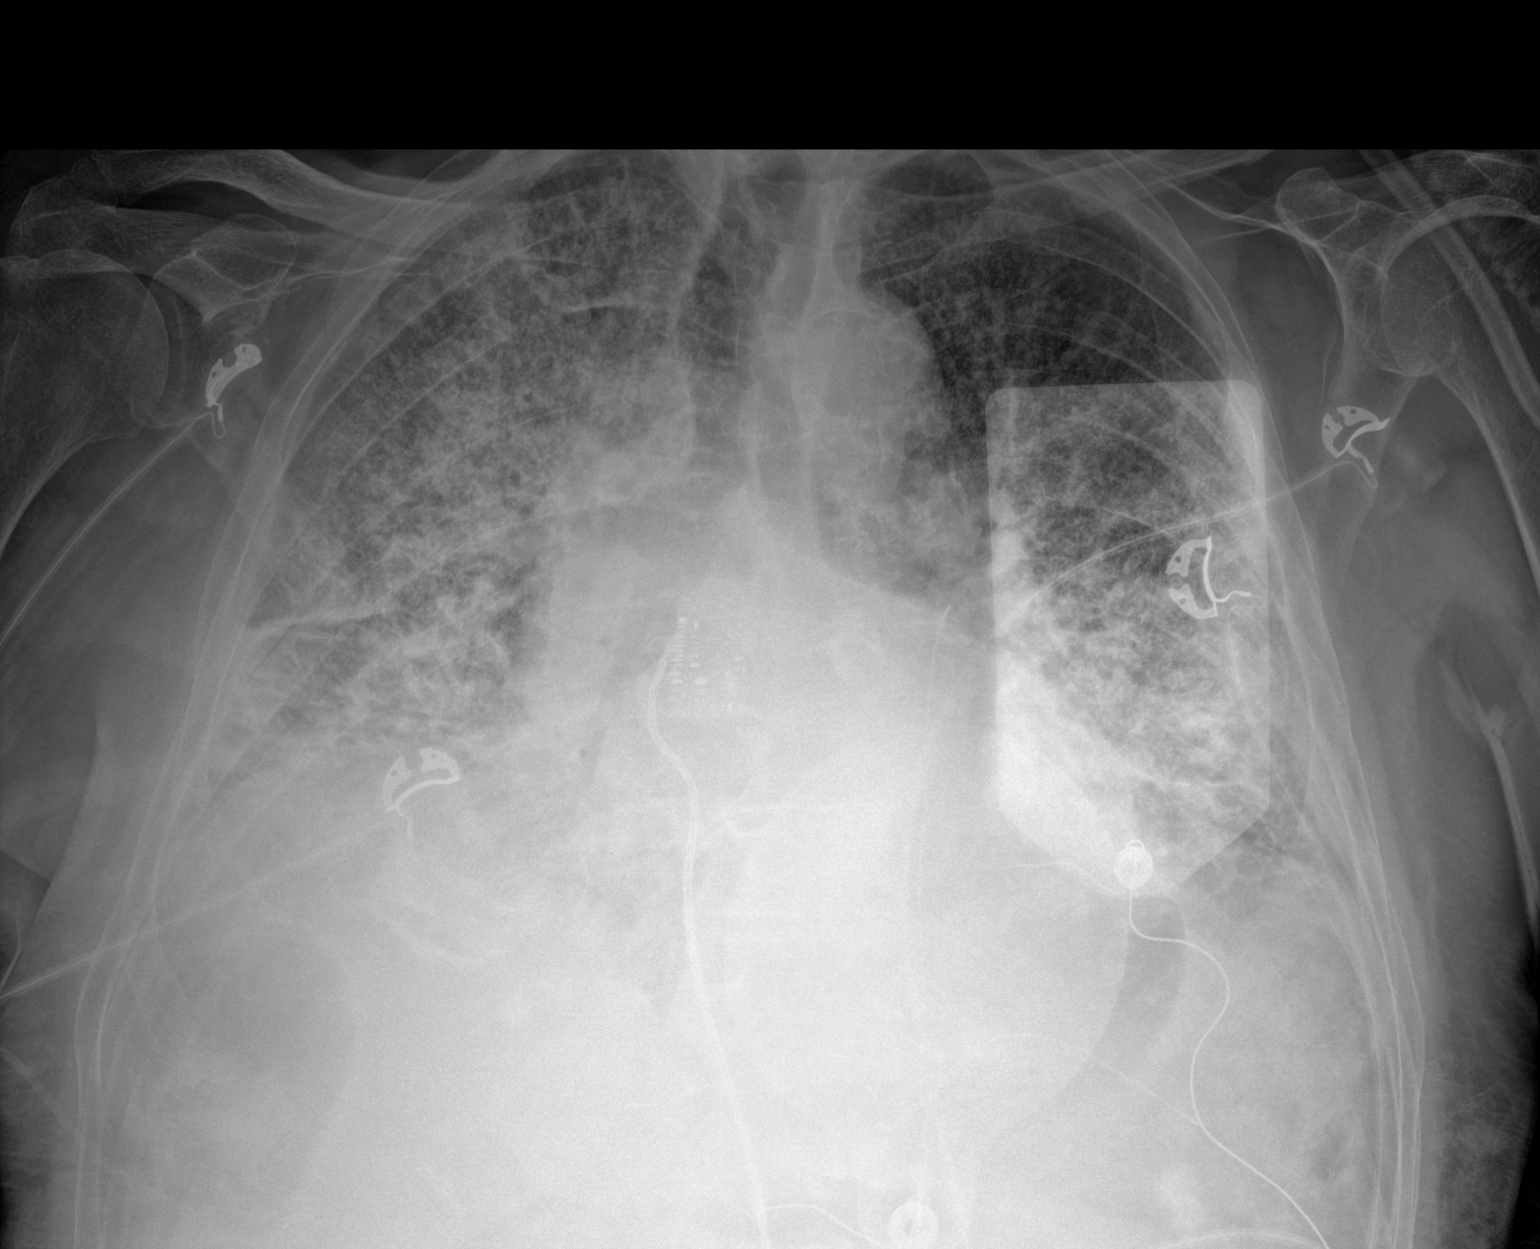

[2 of 2 positions shown; findings below may reference images not displayed]

FINDINGS: Shallow inspiration. Heart size appears enlarged. Diffuse bilateral
airspace disease in the lungs more prominent on the right.
Suggestion of small pleural effusions. Changes may represent edema,
aspiration, or multifocal pneumonia.
IMPRESSION: Cardiac enlargement with diffuse bilateral airspace disease and
small pleural effusions.

## 2020-06-06 IMAGING — CT CT ANGIO CHEST
2 of 6 series · 17 of 46 positions shown · IV contrast (APPLIED)
Comparison: None.

CLINICAL DATA: Shortness of breath

EXAM:
CT ANGIOGRAPHY CHEST WITH CONTRAST
TECHNIQUE: Multidetector CT imaging of the chest was performed using the
standard protocol during bolus administration of intravenous
contrast. Multiplanar CT image reconstructions and MIPs were
obtained to evaluate the vascular anatomy.
CONTRAST:  100mL OMNIPAQUE IOHEXOL 350 MG/ML SOLN

[Series 5: thins · axial · 0.74mm/px · z∈[-258,-27]mm · 15 of 254 slices shown]
[im 12/254  lung]
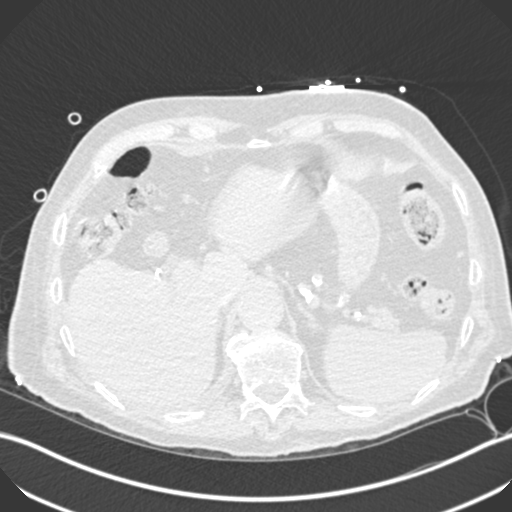
[im 34/254  soft-tissue]
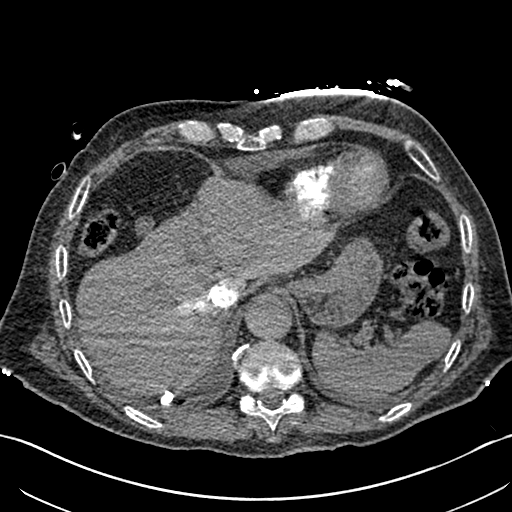
[im 45/254  lung]
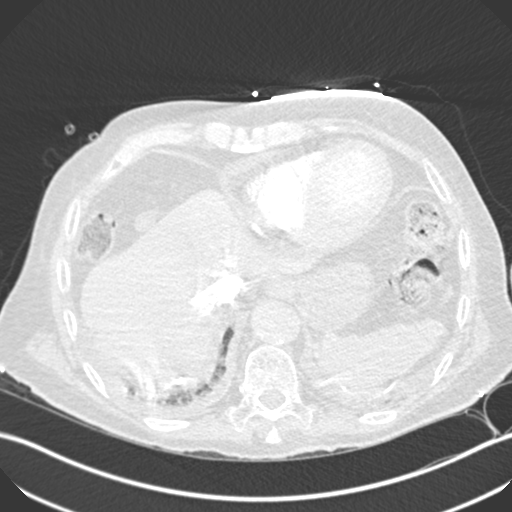
[im 67/254  soft-tissue]
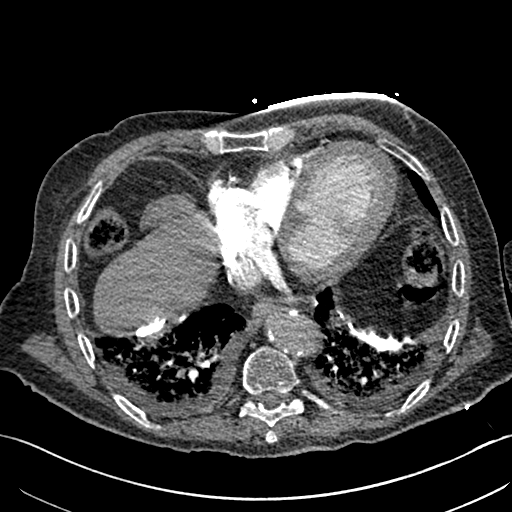
[im 78/254  lung]
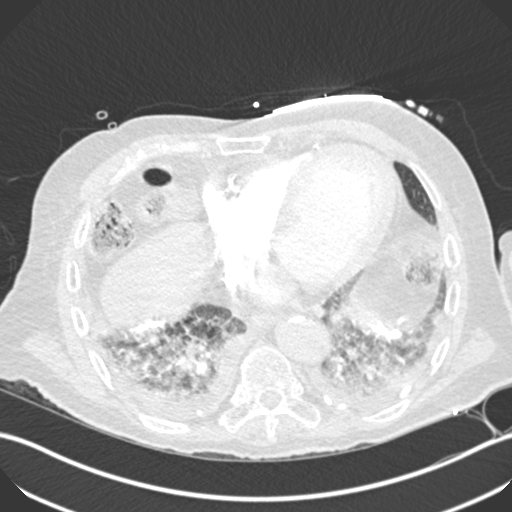
[im 100/254  soft-tissue]
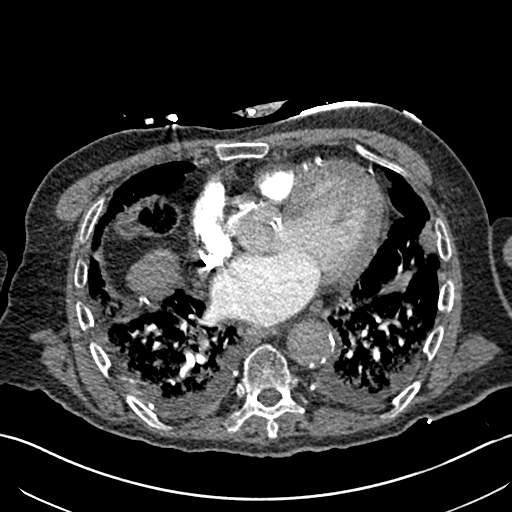
[im 111/254  lung]
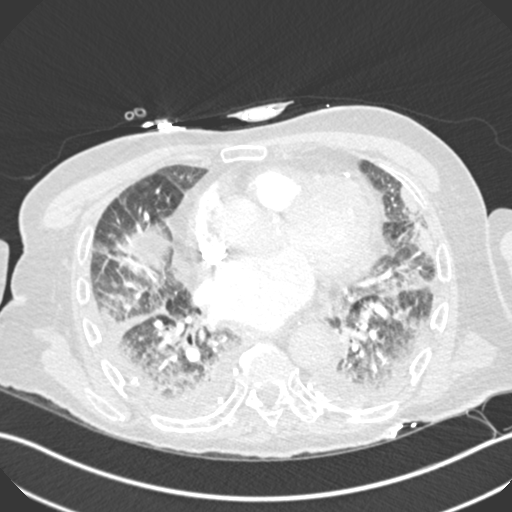
[im 133/254  soft-tissue]
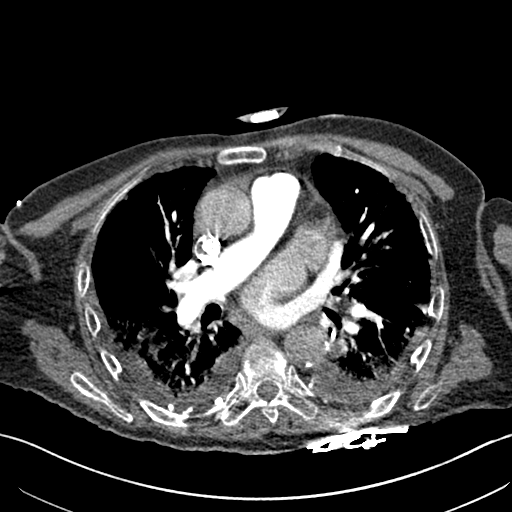
[im 144/254  lung]
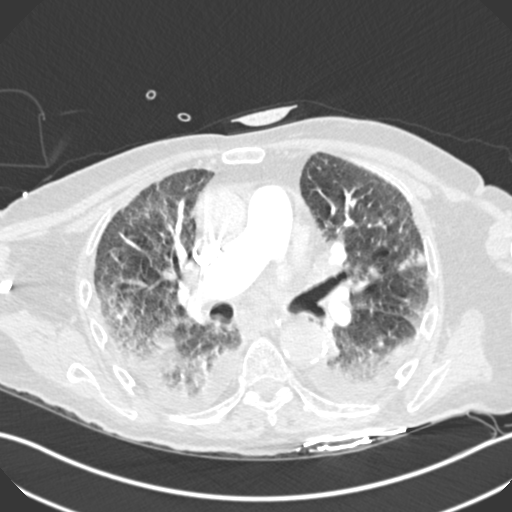
[im 155/254  soft-tissue]
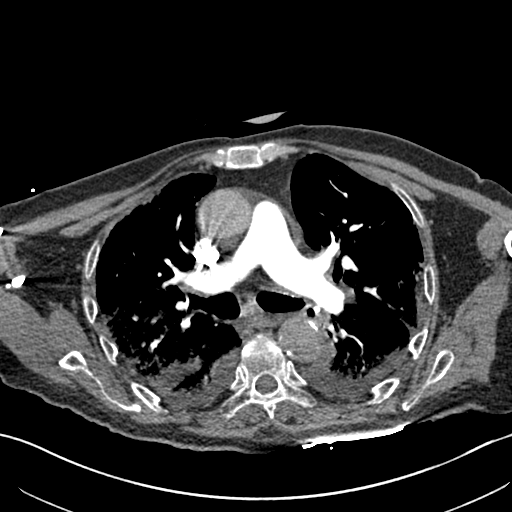
[im 177/254  lung]
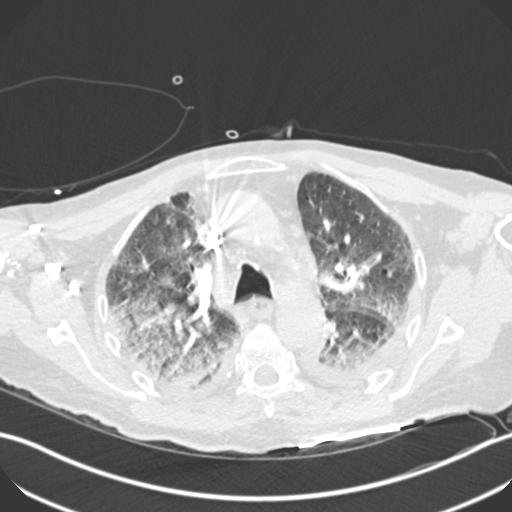
[im 188/254  soft-tissue]
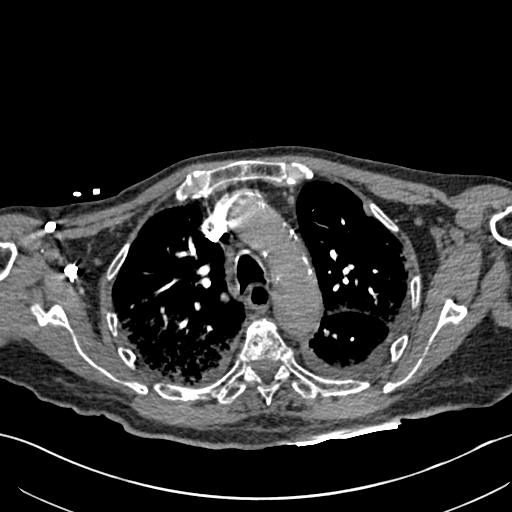
[im 210/254  lung]
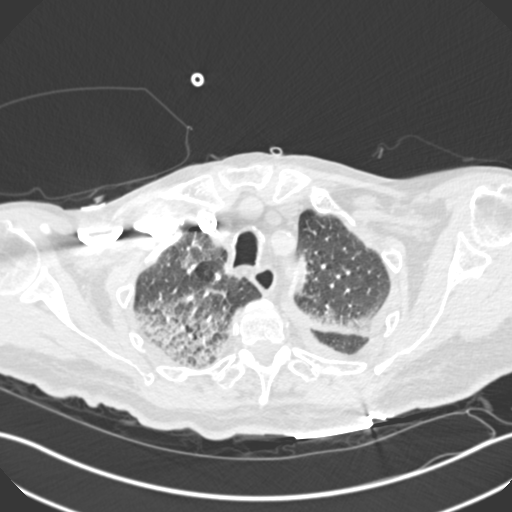
[im 221/254  soft-tissue]
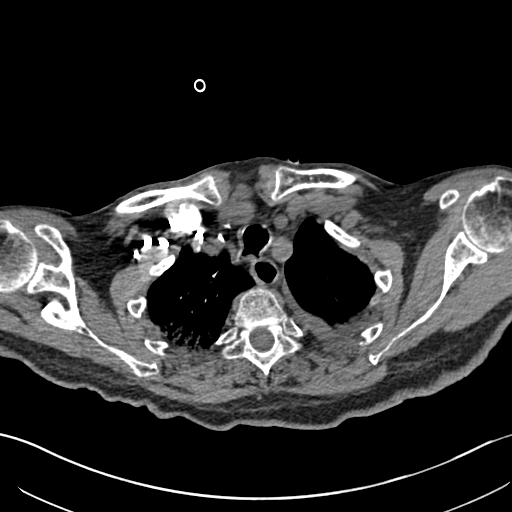
[im 243/254  lung]
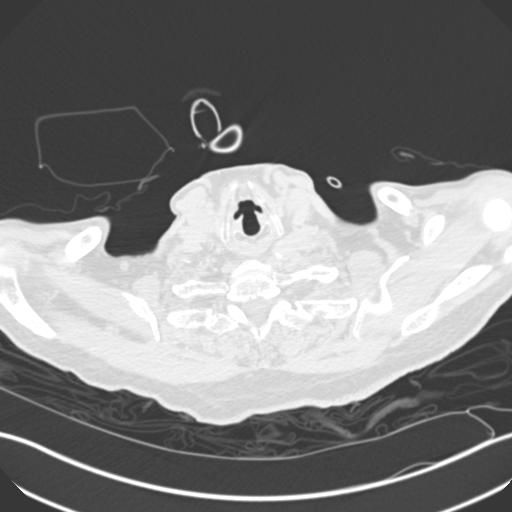

[Series 7: coronal mpr · coronal · 0.50mm/px · 2 of 84 slices shown]
[im 28/84  soft-tissue]
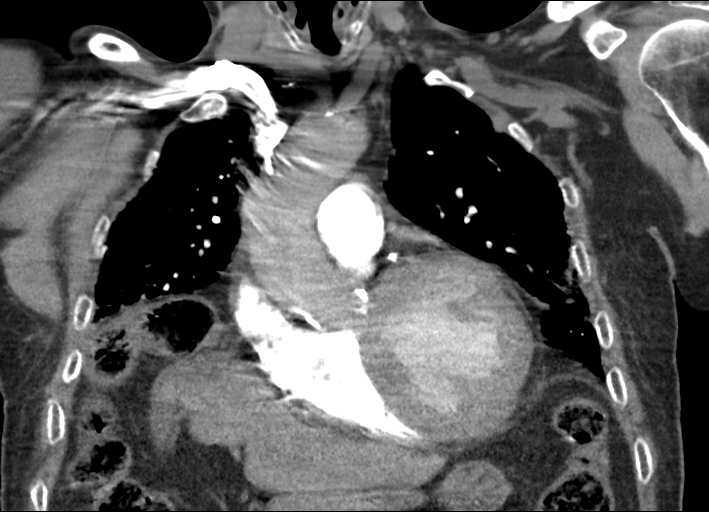
[im 56/84  soft-tissue]
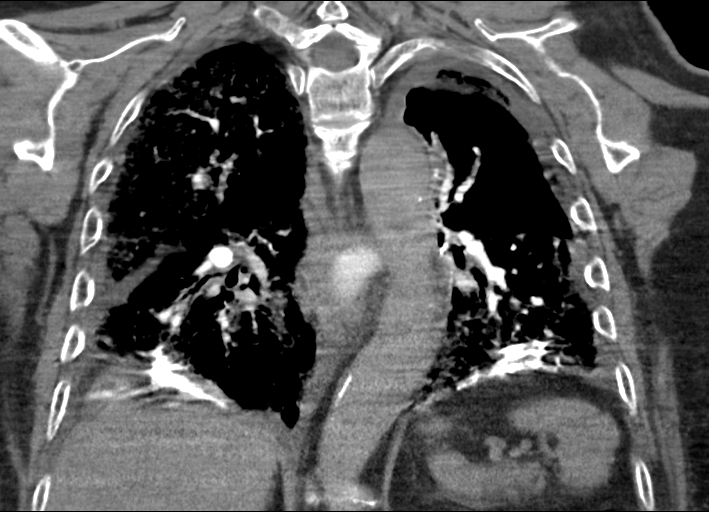

[17 of 46 positions shown; findings below may reference images not displayed]

FINDINGS: Cardiovascular: Satisfactory opacification of the pulmonary arteries
to the segmental level. No evidence of pulmonary embolism. Mild
cardiomegaly. No pericardial effusion. Coronary artery
atherosclerosis. Thoracic aortic atherosclerosis.

Mediastinum/Nodes: No enlarged mediastinal, hilar, or axillary lymph
nodes. Thyroid gland, trachea, and esophagus demonstrate no
significant findings.

Lungs/Pleura: Small bilateral pleural effusions. Bilateral calcified
pleural plaques as can be seen with prior asbestos exposure or
infection. Nodular soft tissue pleural masses along the left lateral
chest wall measuring 2.4 x 1.2 cm 0.7 x 1.8 cm respectively.
Bilateral lower lobe, right middle lobe and bilateral upper lobe
interstitial and alveolar airspace disease most concerning for
multilobar pneumonia including atypical viral pneumonia such as
COVID 19. No pneumothorax.

Upper Abdomen: No acute abnormality.

Musculoskeletal: No acute osseous abnormality. No aggressive osseous
lesion. Age-indeterminate T4 vertebral body compression fracture.

Review of the MIP images confirms the above findings.
IMPRESSION: 1. No evidence of pulmonary embolus.
2. Bilateral lower lobe, right middle lobe and bilateral upper lobe
interstitial and alveolar airspace disease most concerning for
multilobar pneumonia including atypical viral pneumonia such as
COVID 19.
3. Small bilateral pleural effusions.
4. Bilateral calcified pleural plaques as can be seen with prior
asbestos exposure or infection. Nodular soft tissue pleural masses
along the left lateral chest wall measuring 2.4 x 1.2 cm 0.7 x
cm respectively which may reflect focal scarring versus malignancy.
Recommend follow-up CT of the chest with intravenous contrast
following the patient's acute illness.
5. Age-indeterminate T4 vertebral body compression fracture.
6. Aortic Atherosclerosis ([id number]).

## 2020-06-06 MED ORDER — LACTATED RINGERS IV BOLUS (SEPSIS)
1000.0000 mL | Freq: Once | INTRAVENOUS | Status: AC
  Administered 2020-06-06: 1000 mL via INTRAVENOUS

## 2020-06-06 MED ORDER — HYDROCOD POLST-CPM POLST ER 10-8 MG/5ML PO SUER: 5.0000 mL | Freq: Two times a day (BID) | ORAL | Status: DC | PRN

## 2020-06-06 MED ORDER — ATORVASTATIN CALCIUM 20 MG PO TABS
40.0000 mg | ORAL_TABLET | Freq: Every day | ORAL | Status: DC
  Administered 2020-06-06 – 2020-06-11 (×6): 40 mg via ORAL
  Filled 2020-06-06 (×6): qty 2

## 2020-06-06 MED ORDER — METOPROLOL SUCCINATE ER 25 MG PO TB24
12.5000 mg | ORAL_TABLET | Freq: Every day | ORAL | Status: DC
  Filled 2020-06-06 (×3): qty 0.5

## 2020-06-06 MED ORDER — ONDANSETRON HCL 4 MG/2ML IJ SOLN: 4.0000 mg | Freq: Four times a day (QID) | INTRAMUSCULAR | Status: DC | PRN

## 2020-06-06 MED ORDER — SODIUM CHLORIDE 0.9% FLUSH: 3.0000 mL | INTRAVENOUS | Status: DC | PRN

## 2020-06-06 MED ORDER — ALBUTEROL SULFATE HFA 108 (90 BASE) MCG/ACT IN AERS
2.0000 | INHALATION_SPRAY | Freq: Four times a day (QID) | RESPIRATORY_TRACT | Status: DC
  Administered 2020-06-06 – 2020-06-11 (×19): 2 via RESPIRATORY_TRACT
  Filled 2020-06-06: qty 6.7

## 2020-06-06 MED ORDER — DEXAMETHASONE SODIUM PHOSPHATE 10 MG/ML IJ SOLN
6.0000 mg | INTRAMUSCULAR | Status: DC
  Administered 2020-06-06 – 2020-06-11 (×6): 6 mg via INTRAVENOUS
  Filled 2020-06-06 (×3): qty 0.6
  Filled 2020-06-06: qty 1
  Filled 2020-06-06 (×2): qty 0.6

## 2020-06-06 MED ORDER — SODIUM CHLORIDE 0.9 % IV SOLN
100.0000 mg | Freq: Every day | INTRAVENOUS | Status: AC
  Administered 2020-06-07 – 2020-06-10 (×4): 100 mg via INTRAVENOUS
  Filled 2020-06-06 (×4): qty 100

## 2020-06-06 MED ORDER — ISOSORBIDE MONONITRATE ER 30 MG PO TB24
30.0000 mg | ORAL_TABLET | Freq: Every day | ORAL | Status: DC
  Filled 2020-06-06 (×2): qty 1

## 2020-06-06 MED ORDER — SODIUM CHLORIDE 0.9 % IV SOLN: 200.0000 mg | Freq: Once | INTRAVENOUS | Status: DC

## 2020-06-06 MED ORDER — GUAIFENESIN-DM 100-10 MG/5ML PO SYRP: 10.0000 mL | ORAL_SOLUTION | ORAL | Status: DC | PRN

## 2020-06-06 MED ORDER — VANCOMYCIN HCL 1750 MG/350ML IV SOLN
1750.0000 mg | Freq: Once | INTRAVENOUS | Status: AC
  Administered 2020-06-06: 1750 mg via INTRAVENOUS
  Filled 2020-06-06: qty 350

## 2020-06-06 MED ORDER — VANCOMYCIN HCL IN DEXTROSE 1-5 GM/200ML-% IV SOLN
1000.0000 mg | Freq: Once | INTRAVENOUS | Status: DC
  Filled 2020-06-06: qty 200

## 2020-06-06 MED ORDER — ALPRAZOLAM 0.25 MG PO TABS: 0.2500 mg | ORAL_TABLET | Freq: Two times a day (BID) | ORAL | Status: DC | PRN

## 2020-06-06 MED ORDER — SODIUM CHLORIDE 0.9 % IV SOLN: 100.0000 mg | Freq: Every day | INTRAVENOUS | Status: DC

## 2020-06-06 MED ORDER — SODIUM CHLORIDE 0.9 % IV SOLN: 250.0000 mL | INTRAVENOUS | Status: DC | PRN

## 2020-06-06 MED ORDER — ACETAMINOPHEN 325 MG PO TABS: 650.0000 mg | ORAL_TABLET | ORAL | Status: DC | PRN

## 2020-06-06 MED ORDER — SODIUM CHLORIDE 0.9% FLUSH
3.0000 mL | Freq: Two times a day (BID) | INTRAVENOUS | Status: DC
  Administered 2020-06-06 – 2020-06-11 (×11): 3 mL via INTRAVENOUS

## 2020-06-06 MED ORDER — SODIUM CHLORIDE 0.9 % IV SOLN
2.0000 g | Freq: Once | INTRAVENOUS | Status: AC
  Administered 2020-06-06: 2 g via INTRAVENOUS
  Filled 2020-06-06: qty 2

## 2020-06-06 MED ORDER — ASPIRIN EC 81 MG PO TBEC
81.0000 mg | DELAYED_RELEASE_TABLET | Freq: Every day | ORAL | Status: DC
  Administered 2020-06-06 – 2020-06-11 (×6): 81 mg via ORAL
  Filled 2020-06-06 (×6): qty 1

## 2020-06-06 MED ORDER — SODIUM CHLORIDE 0.9 % IV SOLN
3.0000 g | Freq: Four times a day (QID) | INTRAVENOUS | Status: DC
  Administered 2020-06-06 – 2020-06-08 (×7): 3 g via INTRAVENOUS
  Filled 2020-06-06 (×2): qty 3
  Filled 2020-06-06: qty 8
  Filled 2020-06-06: qty 3
  Filled 2020-06-06 (×3): qty 8
  Filled 2020-06-06 (×2): qty 3
  Filled 2020-06-06: qty 8

## 2020-06-06 MED ORDER — FUROSEMIDE 10 MG/ML IJ SOLN
40.0000 mg | Freq: Two times a day (BID) | INTRAMUSCULAR | Status: DC
  Administered 2020-06-06 – 2020-06-11 (×9): 40 mg via INTRAVENOUS
  Filled 2020-06-06 (×9): qty 4

## 2020-06-06 MED ORDER — SACUBITRIL-VALSARTAN 24-26 MG PO TABS
1.0000 | ORAL_TABLET | Freq: Two times a day (BID) | ORAL | Status: DC
  Administered 2020-06-06: 1 via ORAL
  Filled 2020-06-06 (×2): qty 1

## 2020-06-06 MED ORDER — SODIUM CHLORIDE 0.9 % IV SOLN
200.0000 mg | Freq: Once | INTRAVENOUS | Status: AC
  Administered 2020-06-06: 200 mg via INTRAVENOUS
  Filled 2020-06-06: qty 40

## 2020-06-06 MED ORDER — CHLORHEXIDINE GLUCONATE CLOTH 2 % EX PADS
6.0000 | MEDICATED_PAD | Freq: Every day | CUTANEOUS | Status: DC
  Administered 2020-06-06 – 2020-06-10 (×3): 6 via TOPICAL

## 2020-06-06 MED ORDER — ZINC SULFATE 220 (50 ZN) MG PO CAPS
220.0000 mg | ORAL_CAPSULE | Freq: Every day | ORAL | Status: DC
  Administered 2020-06-07 – 2020-06-11 (×5): 220 mg via ORAL
  Filled 2020-06-06 (×6): qty 1

## 2020-06-06 MED ORDER — IOHEXOL 350 MG/ML SOLN
100.0000 mL | Freq: Once | INTRAVENOUS | Status: AC | PRN
  Administered 2020-06-06: 100 mL via INTRAVENOUS

## 2020-06-06 MED ORDER — ENOXAPARIN SODIUM 40 MG/0.4ML IJ SOSY
40.0000 mg | PREFILLED_SYRINGE | INTRAMUSCULAR | Status: DC
  Administered 2020-06-06 – 2020-06-11 (×6): 40 mg via SUBCUTANEOUS
  Filled 2020-06-06 (×6): qty 0.4

## 2020-06-06 MED ORDER — FUROSEMIDE 10 MG/ML IJ SOLN
40.0000 mg | Freq: Once | INTRAMUSCULAR | Status: AC
  Administered 2020-06-06: 40 mg via INTRAVENOUS
  Filled 2020-06-06: qty 4

## 2020-06-06 MED ORDER — ASCORBIC ACID 500 MG PO TABS
500.0000 mg | ORAL_TABLET | Freq: Every day | ORAL | Status: DC
  Administered 2020-06-07 – 2020-06-11 (×5): 500 mg via ORAL
  Filled 2020-06-06 (×6): qty 1

## 2020-06-06 NOTE — ED Notes (Signed)
Pt tolerating food and drink well at this time. Pt reminded that when he finishes food and drink to put mask back on. Pt verbalized understanding.

## 2020-06-06 NOTE — ED Notes (Signed)
Unable to pass foley catheter d/t prostate. External cath placed, dr York Cerise aware and okay with external cath vs foley.

## 2020-06-06 NOTE — Sepsis Progress Note (Signed)
Following for Code Sepsis  

## 2020-06-06 NOTE — ED Notes (Signed)
RT at bedside.

## 2020-06-06 NOTE — ED Notes (Signed)
Pt changed into clean gown at this time. Primofit changed due to leaking at this time. New primofit clean, dry and intact. New warm blankets given at this time, pt denies further needs

## 2020-06-06 NOTE — Progress Notes (Signed)
CODE SEPSIS - PHARMACY COMMUNICATION  **Broad Spectrum Antibiotics should be administered within 1 hour of Sepsis diagnosis**  Time Code Sepsis Called/Page Received:  4/29 @ 0217  Antibiotics Ordered: Cefepime, Vancomycin   Time of 1st antibiotic administration: Cefepime 2 gm IV X 1 @ 0236.   Additional action taken by pharmacy:   If necessary, Name of Provider/Nurse Contacted:     Loree Shehata D ,PharmD Clinical Pharmacist  06/06/2020  2:45 AM

## 2020-06-06 NOTE — H&P (Signed)
History and Physical    Douglas Vance RKY:[id number] DOB: Mar 22, 1934 DOA: 06/06/2020  PCP: [facility]   Patient coming from: Home  I have personally briefly reviewed patient's old medical records in [facility]  Chief Complaint: Shortness of breath  HPI: Douglas Vance is a 86 y.o. male with medical history significant for CAD with history of MI status post PCI x9 with nonischemic Myoview 03/2020, systolic heart failure last EF 30 to 35% 03/2020, HTN, history of GI bleed Who presents to the ED by EMS in acute respiratory distress with O2 sat of 75% on NRB at 15 L.  Patient reported a 2-week history of shortness of breath becoming acutely worse on the night of arrival.  Was administered albuterol and Solu-Medrol in route.  Vomited on arrival in the emergency room with acute worsening of his symptoms.  History limited due to acuity of illness.  Patient had recently moved to the area following the death of his wife in 2021/09/17and had establish care with cardiologist locally and had been recently in the past month complaining of chest pain and low blood pressures with medication adjustments. ED Course: On arrival, tachypneic at 102, BP 189/93 with O2 sats 75% on nonrebreather.  Patient was about to be intubated but improved with suctioning and was placed on BiPAP.  Venous pH of 7.19 with PCO2 78.  Blood work returned with WBC 11,800 and lactic acid of 2.4.  Procalcitonin less than 0.1.  Troponin of 19, BNP 1222.  COVID PCR returned positive EKG as reviewed by me : Sinus tach at 105 with LBBB, as seen on EKG on 4/20 Imaging: Chest x-ray:Cardiac enlargement with diffuse bilateral airspace disease and small pleural effusions  Prior to positive COVID patient was treated as sepsis of unknown source.  COVID subsequently resulted positive.  Patient did have improvement in respiratory status with BiPAP.  Hospitalist consulted for admission.  Review of Systems:Unable to obtain due to  clinical condition   Past Medical History:  Diagnosis Date  . BPH (benign prostatic hyperplasia)   . Coronary artery disease   . Hypertension   . Myocardial infarction [facility])     Past Surgical History:  Procedure Laterality Date  . CARDIAC CATHETERIZATION    . VASECTOMY       reports that he has quit smoking. He has never used smokeless tobacco. He reports previous alcohol use. He reports that he does not use drugs.  Allergies  Allergen Reactions  . Cipro [Ciprofloxacin Hcl]     History reviewed. No pertinent family history.    Prior to Admission medications   Medication Sig Start Date End Date Taking? Authorizing Provider  aspirin EC 81 MG tablet Take 81 mg by mouth daily. Swallow whole.    [provider]  donepezil (ARICEPT) 5 MG tablet Take 5 mg by mouth at bedtime.    [provider]  isosorbide mononitrate (IMDUR) 30 MG 24 hr tablet Take 1 tablet (30 mg total) by mouth daily. 03/18/20 06/16/20  Debbe Odea, MD  levothyroxine (SYNTHROID) 25 MCG tablet Take 25 mcg by mouth every morning. 04/14/20   [provider]  lovastatin (MEVACOR) 20 MG tablet Take 20 mg by mouth at bedtime.    [provider]  metoprolol succinate (TOPROL-XL) 25 MG 24 hr tablet Take 0.5 tablets (12.5 mg total) by mouth daily. 03/27/20   Debbe Odea, MD  omeprazole (PRILOSEC) 20 MG capsule Take 20 mg by mouth daily.    [provider]  sertraline (ZOLOFT) 25 MG tablet Take 25 mg by mouth daily. 04/14/20   [provider]  tamsulosin (FLOMAX) 0.4 MG CAPS capsule Take 0.4 mg by mouth daily.    [provider]  traZODone (DESYREL) 50 MG tablet Take 25 mg by mouth at bedtime. 04/14/20   [provider]  VITAMIN D PO Take 2,000 Int'l Units/day by mouth daily.    [provider]    Physical Exam: Vitals:   06/06/20 0245 06/06/20 0255 06/06/20 0300 06/06/20 0315  BP:   (!) 153/70 (!) 146/68  Pulse: (!) 129 94 89 86   Resp: (!) 40 (!) 36 (!) 29 (!) 35  Temp:      TempSrc:      SpO2: (!) 89% 92% 93% 95%  Weight:      Height:         Vitals:   06/06/20 0245 06/06/20 0255 06/06/20 0300 06/06/20 0315  BP:   (!) 153/70 (!) 146/68  Pulse: (!) 129 94 89 86  Resp: (!) 40 (!) 36 (!) 29 (!) 35  Temp:      TempSrc:      SpO2: (!) 89% 92% 93% 95%  Weight:      Height:          Constitutional:  Elderly male, frail appearing.  Alert.  On BiPAP  HEENT:      Head: Normocephalic and atraumatic.         Eyes: PERLA, EOMI, Conjunctivae are normal. Sclera is non-icteric.       Mouth/Throat: Mucous membranes are moist.       Neck: Supple with no signs of meningismus. Cardiovascular: Tachycardic. No murmurs, gallops, or rubs. 2+ symmetrical distal pulses are present . No JVD.  1+ LE edema Respiratory: Respiratory effort increased.Lungs sounds diminished bilaterally.  Bilateral wheezes, coarse breath sounds and crackles Gastrointestinal: Soft, non tender, and non distended with positive bowel sounds.  Genitourinary: No CVA tenderness. Musculoskeletal: Nontender with normal range of motion in all extremities. No cyanosis, or erythema of extremities. Neurologic:  Face is symmetric. Moving all extremities. No gross focal neurologic deficits . Skin: Skin is warm, dry.  No rash or ulcer Psychiatric: Mood and affect are normal    Labs on Admission: I have personally reviewed following labs and imaging studies  CBC: Recent Labs  Lab 06/06/20 0214  WBC 11.8*  NEUTROABS 6.6  HGB 15.2  HCT 46.8  MCV 93.4  PLT 240   Basic Metabolic Panel: Recent Labs  Lab 06/06/20 0214  NA 139  K 3.6  CL 106  CO2 26  GLUCOSE 132*  BUN 11  CREATININE 1.00  CALCIUM 8.9   GFR: Estimated Creatinine Clearance: 52.3 mL/min (by C-G formula based on SCr of 1 mg/dL). Liver Function Tests: Recent Labs  Lab 06/06/20 0214  AST 16  ALT 12  ALKPHOS 71  BILITOT 0.8  PROT 8.1  ALBUMIN 4.1   No results for input(s):  LIPASE, AMYLASE in the last 168 hours. No results for input(s): AMMONIA in the last 168 hours. Coagulation Profile: Recent Labs  Lab 06/06/20 0214  INR 1.0   Cardiac Enzymes: No results for input(s): CKTOTAL, CKMB, CKMBINDEX, TROPONINI in the last 168 hours. BNP (last 3 results) No results for input(s): PROBNP in the last 8760 hours. HbA1C: No results for input(s): HGBA1C in the last 72 hours. CBG: No results for input(s): GLUCAP in the last 168 hours. Lipid Profile: No results for input(s): CHOL, HDL, LDLCALC,  TRIG, CHOLHDL, LDLDIRECT in the last 72 hours. Thyroid Function Tests: No results for input(s): TSH, T4TOTAL, FREET4, T3FREE, THYROIDAB in the last 72 hours. Anemia Panel: No results for input(s): VITAMINB12, FOLATE, FERRITIN, TIBC, IRON, RETICCTPCT in the last 72 hours. Urine analysis: No results found for: COLORURINE, APPEARANCEUR, LABSPEC, PHURINE, GLUCOSEU, HGBUR, BILIRUBINUR, KETONESUR, PROTEINUR, UROBILINOGEN, NITRITE, LEUKOCYTESUR  Radiological Exams on Admission: DG Chest Port 1 View  Result Date: 06/06/2020 CLINICAL DATA:  Shortness of breath and decreased oxygenation. Two week history of shortness of breath worsening tonight. Vomiting tonight. EXAM: PORTABLE CHEST 1 VIEW COMPARISON:  None. FINDINGS: Shallow inspiration. Heart size appears enlarged. Diffuse bilateral airspace disease in the lungs more prominent on the right. Suggestion of small pleural effusions. Changes may represent edema, aspiration, or multifocal pneumonia. IMPRESSION: Cardiac enlargement with diffuse bilateral airspace disease and small pleural effusions. Electronically Signed   By: Burman NievesWilliam  Stevens M.D.   On: 06/06/2020 02:45     Assessment/Plan 86 year old male with history of CAD with history of MI status post PCI x9 with nonischemic Myoview 03/2020, systolic heart failure last EF 30 to 35% 03/2020, HTN, history of GI bleed Who presents to the ED by EMS in acute respiratory distress with O2 sat  of 75% on NRB at 15 L.      Acute respiratory failure with hypoxia (HCC) - Suspect secondary to CHF exacerbation with possible COVID-pneumonia or combination of both - Patient presented in marked respiratory distress on NRB at 15 and subsequently placed on BiPAP.  Venous blood gas with pH of 7.15 and PCO2 78 - Continue BiPAP and wean as tolerated to O2 via nasal cannula - Treat each potential etiology - Admitted to stepdown   Acute on chronic systolic CHF [facility](HCC) - Patient presents with marked respiratory distress, BNP of 1222 with chest x-ray showing Bilateral airspace disease which may represent edema aspiration or multifocal pneumonia - Last echocardiogram was in February 2022 with EF of 30 to 35%, grade 1 diastolic dysfunction and global hypokinesis - IV Lasix 40 mg twice daily - Continue beta-blocker.  Not currently on ACE/ARB's pending med rec - Daily weights, intake and output monitoring  Probable pneumonia due to COVID-19 virus - Severe respiratory distress with hypoxia and COVID-positive - Chest x-ray with bilateral airspace disease - Uncertain whether COVID is incidental - Inflammatory biomarkers to include CRP, ferritin and D-dimer - Remdesivir treatment, vitamins, antitussives, albuterol - Supplemental oxygen as above - Airborne precautions    Lactic acidosis - Likely secondary to tissue hypoxia from severe respiratory distress with hypoxemia - Sepsis no longer suspected.  Procalcitonin less than 0.1    CAD status post PCI x9 - Troponin of 19 with history of nonischemic Myoview in February 2022 - Patient intolerant of DAPT and is on aspirin only - Continue beta-blocker, statin, Imdur as BP will tolerate - Cardiology consult    HTN (hypertension) - Continue antihypertensives pending med rec    History of GI bleed - No acute concerns at this time    DVT prophylaxis: Lovenox  Code Status: DNR Family Communication: Daughter Tressie StalkerDeborah Howard at bedside Disposition  Plan: Back to previous home environment Consults called: Cardiology Status:At the time of admission, it appears that the appropriate admission status for this patient is INPATIENT. This is judged to be reasonable and necessary in order to provide the required intensity of service to ensure the patient's safety given the presenting symptoms, physical exam findings, and initial radiographic and laboratory data in the context of their  Comorbid conditions.   Patient requires inpatient status due to high intensity of service, high risk for further deterioration and high frequency of surveillance required.   I certify that at the point of admission it is my clinical judgment that the patient will require inpatient hospital care spanning beyond 2 midnights     Andris Baumann MD [facility]     06/06/2020, 3:59 AM

## 2020-06-06 NOTE — Progress Notes (Signed)
Patient admitted from ED via stretcher on NRB 15L O2. Patient attached to bedside monitor, VSS. Minimal complaints of SOB, denies any pain or discomfort at this time. RT planning to switch to [facility].

## 2020-06-06 NOTE — ED Triage Notes (Signed)
Pt to ED via EMS for SOB, 75% on arrival on 15L NRB. Per EMS pt has reports 2 weeks of SOB, worsening acute tonight, emesis x1 on arrival to ED. PT A&Ox4 at this time. 1 albuterol tx and 125 solumedrol given on route.

## 2020-06-06 NOTE — Progress Notes (Signed)
PHARMACY -  BRIEF ANTIBIOTIC NOTE   Pharmacy has received consult(s) for Vancomycin , Cefepime from an ED provider.  The patient's profile has been reviewed for ht/wt/allergies/indication/available labs.    One time order(s) placed for Vancomycin 1750 mg IV X 1,  Cefepime 2 gm IV X 1 .   Further antibiotics/pharmacy consults should be ordered by admitting physician if indicated.                       Thank you, Arvine Clayburn D 06/06/2020  2:30 AM

## 2020-06-06 NOTE — Consult Note (Signed)
Pharmacy Antibiotic Note  Douglas Vance is a 86 y.o. male admitted on 06/06/2020 with pneumonia.  Pharmacy has been consulted for Unasyn dosing. COVID+ on remdesivir.   Plan: Unasyn 3g q6H.   Height: 5\' 7"  (170.2 cm) Weight: 73.8 kg (162 lb 11.2 oz) IBW/kg (Calculated) : 66.1  Temp (24hrs), Avg:96.7 F (35.9 C), Min:96.7 F (35.9 C), Max:96.7 F (35.9 C)  Recent Labs  Lab 06/06/20 0214 06/06/20 0427  WBC 11.8*  --   CREATININE 1.00  --   LATICACIDVEN 2.4* 1.5    Estimated Creatinine Clearance: 52.3 mL/min (by C-G formula based on SCr of 1 mg/dL).    Allergies  Allergen Reactions  . Cipro [Ciprofloxacin Hcl]     Antimicrobials this admission: 4/29 Unaysn >>   Dose adjustments this admission: None  Microbiology results: 4/29 BCx: pending 4/29 UCx: pending   Thank you for allowing pharmacy to be a part of this patient's care.  5/29 06/06/2020 6:12 PM

## 2020-06-06 NOTE — ED Notes (Signed)
Pt's family members walked back to room by Dr. York Cerise and provided with update

## 2020-06-06 NOTE — ED Notes (Signed)
Pt maintaining 96-99% on NRB 15L for the last 2 hours. Per verbal order from MD Grunz to wait 2 hours then advance pt diet, pt given small amounts of food and drink at this time.

## 2020-06-06 NOTE — ED Notes (Signed)
Called xray at this time for bedside xray

## 2020-06-06 NOTE — ED Notes (Addendum)
Full rainbow collected with temp labels and sent to lab

## 2020-06-06 NOTE — ED Notes (Signed)
MD Lacey Jensen regarding lasix orders. Per MAR, pt to get lasix BID (1000 and 1800). This RN notified MD Jarvis Newcomer that pt received a dose of lasix at 0430 this AM. Per MD Jarvis Newcomer, hold 1000 dose and give next dose as ordered at 1800

## 2020-06-06 NOTE — Progress Notes (Signed)
Remdesivir - Pharmacy Brief Note   O:  ALT: 12 CXR:  SpO2:  95% on BiPap   A/P:  Remdesivir 200 mg IVPB once followed by 100 mg IVPB daily x 4 days.   Symphoni Helbling D 06/06/2020 3:49 AM

## 2020-06-06 NOTE — ED Notes (Signed)
Pt's daughter Reece Levy and son in law John at bedside with pt. Plan remains the same at this time with bipap in place

## 2020-06-06 NOTE — ED Notes (Signed)
Pt bed linens changed at this time. Pt external cath changed at this time due to leaking. Clean chux also placed on bed at this time. Pt tolerated well. Pt denies further needs at this time

## 2020-06-06 NOTE — ED Notes (Signed)
Pt pale, accessory muscle use with breathing. Multiple RNs and RT at bedside preparing for intubation. Dr. York Cerise discussing pt's wishes with pt and pt's daughter Gavin Pound.

## 2020-06-06 NOTE — ED Notes (Signed)
Lab reports they are unable to use the save tubes sent down when pt arrived at the ED for the D-dimer and CRP/Ferritin, requesting new tubes

## 2020-06-06 NOTE — ED Notes (Signed)
Pt's family members given coffee and ice water. Pt tolerating Bi-Pap well. Pt and pt's family deny further needs at this time

## 2020-06-06 NOTE — ED Notes (Addendum)
This RN responded to bipap alarm to see pt had taken off Bipap mask. Per pt, "is this about done" upon asking for clarification, pt stated he did not want to wear the bipap mask anymore. This RN placed pt on NRB @ 15L, pt O2 sat 95% on NRB. Pt denies increased work of breathing at this time, states he feels a little better.   Pt currently maintaining 95-99% on 15L via NRB.   MD Jarvis Newcomer made aware of pt wanting to be off bipap and current pt condition at this time. Awaiting new orders.

## 2020-06-06 NOTE — Progress Notes (Signed)
PROGRESS NOTE  Brief Narrative: Douglas Vance is an 86 y.o. male with a history of CAD, chronic HFrEF (LVEF 30-35%), GI bleeding, and HTN who presented to the ED by EMS with respiratory distress, hypoxia requiring 15L NRB that had worsened over the previous couple weeks. He was hypoxic with respiratory acidosis by VBG, BNP elevated to 1222, troponin 19 > 105, and covid-19 PCR was positive. Lactic acid 2.4 > 1.5. WBC 11.8. He vomited and there was concern for aspiration pneumonia for which antibiotics were given empirically for sepsis. CXR showed bilateral airspace disease and small effusions. He was placed on BiPAP after DNR and DNI status was confirmed and respiratory status has stabilized. CRP noted to be normal.   Subjective: Seen early this morning with daughter and SIL at bedside. They and the patient report he has really stabilized with his breathing in the past hour or so since getting lasix. He denies chest pain or current nausea. Very dry mouth.   Objective: BP (!) 146/68   Pulse 77   Temp (!) 96.7 F (35.9 C) (Axillary)   Resp (!) 25   Ht 5\' 7"  (1.702 m)   Wt 73.8 kg   SpO2 97%   BMI 25.48 kg/m   Gen: Elderly male in no acute distress Pulm: Tachypneic, diminished, no wheezes. +Crackles. CV: RRR, no murmur, no JVD, + edema GI: Soft, NT, ND, +BS  Neuro: Alert and oriented. No focal deficits. Skin: No rashes, lesions or ulcers on visualized skin  Assessment & Plan: Principal Problem:   Acute on chronic systolic CHF (congestive heart failure) (HCC) Active Problems:   CAD (coronary artery disease)   HTN (hypertension)   History of GI bleed   Pneumonia due to COVID-19 virus   Acute respiratory failure with hypoxia (HCC)   Lactic acidosis   Acute hypoxic and hypercarbic respiratory failure: Due to acute CHF, possible contributions of covid-19 and aspiration pneumonitis/pneumonia, with tobacco use history, suspect COPD at baseline though no wheezing to suggest exacerbation  at this time. PCT negative on arrival. Based on celerity of improvement, lack of inflammatory marker elevation, strongly suspect CHF is primary driver of respiratory distress. - Continue BiPAP prn for next 12-24 hours. He is DNR, DNI but does wish to use BiPAP if felt to be necessary to sustain life. Otherwise, will attempt to wean oxygen.  - Diuresis as below - Covid treatment to include remdesivir, steroids - Continue breathing treatments, will need to give inhalers in lieu of nebs due to covid positivity.  - Continue antibiotics with unasyn only given witnessed aspiration event in ED, though bacterial PNA is felt to be unlikely primary cause of presentation.  - D-dimer is ~2 in setting of nonelevated CRP. Will r/o PE with CTA chest once stable off BiPAP.  Acute on chronic HFrEF:  - Continue lasix IV BID - Monitor I/O, daily weights, BMP. Having good UOP. - ?if CRT would be indicated for this patient.  Troponin elevation, CAD s/p PCI x9: No chest pain. BBB is preexisting. Nonischemic myoview Feb 2022. - Recheck troponin to confirm it is not continuing to rise.  - Defer heparinization/management to cardiology. Not starting currently in absence of chest pain and with high risk for GI bleeding.   Covid-19 infection: Unclear whether opacities are related to covid status (i.e. whether he has covid pneumonia).  - Started on remdesivir and standard dose decadron which we will continue. - Isolation - Unclear whether this suggests proning would be beneficial. Will try if stabilizing.  Tyrone Nine, MD Pager on amion 06/06/2020, 4:53 PM

## 2020-06-06 NOTE — ED Notes (Signed)
Trialed pt off of Bipap at this time in order to give PO meds. Pt O2 sat dropped to 90% and this RN noted increased work of breathing at this time. Pt denied feeling increasingly SOB or any other distress. Pt placed back on Bipap at this, pt currently maintaining 97-99% on Bipap. Pt denies further needs.

## 2020-06-06 NOTE — ED Notes (Signed)
Pt's dentures removed prior to bipap placement

## 2020-06-06 NOTE — ED Notes (Addendum)
RT called at this time due to BiPAP alarming "low internal battery" RT on way to pt room at this time.

## 2020-06-06 NOTE — ED Notes (Signed)
Dr. Duncan at bedside 

## 2020-06-06 NOTE — ED Notes (Signed)
Pt reported on arrival that he believes he has a DNR form filled out. Per EMS no known DNR paperwork. Dr. York Cerise reports he spoke with Gavin Pound who verified pt is a DNR and the form is at his house in his bedroom. Pt's daughter on her way to ED. Per Dr. York Cerise, pt to be placed on bi-pap. Dr. York Cerise suctioning mouth at this time.

## 2020-06-06 NOTE — ED Notes (Addendum)
Messaged MD Jarvis Newcomer regarding pt BP increasing at this time, and inability to tolerate PO meds due to necessary bipap use at this time. Per MD Jarvis Newcomer, no new orders regarding BP meds in order to avoid hypotension. Per MD Jarvis Newcomer, keep bipap on at this time.

## 2020-06-06 NOTE — ED Provider Notes (Signed)
[facility] Emergency Department Provider Note  ____________________________________________   Event Date/Time   First MD Initiated Contact with Patient 06/06/20 [phone]     (approximate)  I have reviewed the triage vital signs and the nursing notes.   HISTORY  Chief Complaint Shortness of Breath  Level 5 caveat:  history/ROS limited by acute/critical illness  HPI Douglas Vance is a 86 y.o. male with medical history as listed below who presents with respiratory failure.  EMS was called out to his independent living facility and found that he was profoundly hypoxemic and extremely ill-appearing with a bluish pale appearance and very weak and nearly unable to speak.  He was given Solu-Medrol 125 mg IV and 1 albuterol nebulizer treatment prior to arrival and was placed on 15 L of oxygen by nonrebreather and was still satting about 75%.  He vomited just as they were arriving to the hospital and had some degree of aspiration.  The patient reports that he has been feeling ill for about 2 weeks and is gradually been getting worse and became severe tonight.  Upon arrival he is awake and alert but critically ill and appears to be on the verge of losing his airway.        Past Medical History:  Diagnosis Date  . BPH (benign prostatic hyperplasia)   . Coronary artery disease   . Hypertension   . Myocardial infarction [facility])     Patient Active Problem List   Diagnosis Date Noted  . CAD (coronary artery disease) 06/06/2020  . HTN (hypertension) 06/06/2020  . History of GI bleed 06/06/2020  . Pneumonia due to COVID-19 virus 06/06/2020  . Acute respiratory failure with hypoxia (HCC) 06/06/2020  . Acute respiratory failure with hypoxemia (HCC) 06/06/2020  . Acute on chronic systolic CHF (congestive heart failure) (HCC) 06/06/2020  . Lactic acidosis 06/06/2020    Past Surgical History:  Procedure Laterality Date  . CARDIAC CATHETERIZATION    . VASECTOMY       Prior to Admission medications   Medication Sig Start Date End Date Taking? Authorizing Provider  donepezil (ARICEPT) 5 MG tablet Take 5 mg by mouth at bedtime.   Yes [provider]  isosorbide mononitrate (IMDUR) 30 MG 24 hr tablet Take 1 tablet (30 mg total) by mouth daily. 03/18/20 06/16/20 Yes Agbor-Etang, Arlys John, MD  levothyroxine (SYNTHROID) 50 MCG tablet Take 50 mcg by mouth every morning. 04/14/20  Yes [provider]  lovastatin (MEVACOR) 20 MG tablet Take 20 mg by mouth daily with supper.   Yes [provider]  metoprolol succinate (TOPROL-XL) 25 MG 24 hr tablet Take 0.5 tablets (12.5 mg total) by mouth daily. Patient taking differently: Take 25 mg by mouth daily. 03/27/20  Yes Agbor-Etang, Arlys John, MD  omeprazole (PRILOSEC) 20 MG capsule Take 20 mg by mouth daily.   Yes [provider]  sertraline (ZOLOFT) 50 MG tablet Take 50 mg by mouth daily. 04/14/20  Yes [provider]  aspirin EC 81 MG tablet Take 81 mg by mouth daily. Swallow whole.    [provider]  tamsulosin (FLOMAX) 0.4 MG CAPS capsule Take 0.4 mg by mouth daily.    [provider]  traZODone (DESYREL) 50 MG tablet Take 25 mg by mouth at bedtime. 04/14/20   [provider]  VITAMIN D PO Take 2,000 Int'l Units/day by mouth daily.    [provider]    Allergies Cipro [ciprofloxacin hcl]  No family history on file.  Social  History Social History   Tobacco Use  . Smoking status: Former Games developer  . Smokeless tobacco: Never Used  Vaping Use  . Vaping Use: Never used  Substance Use Topics  . Alcohol use: Not Currently  . Drug use: Never    Review of Systems Level 5 caveat:  history/ROS limited by acute/critical illness ____________________________________________   PHYSICAL EXAM:  VITAL SIGNS: ED Triage Vitals  Enc Vitals Group     BP 06/06/20 0208 (!) 189/93     Pulse Rate 06/06/20 0206 (!) 102     Resp 06/06/20 0210 (!) 34      Temp 06/06/20 0217 (!) 96.7 F (35.9 C)     Temp Source 06/06/20 0217 Axillary     SpO2 06/06/20 0205 (S) (!) 81 %     Weight 06/06/20 0209 73.8 kg (162 lb 11.2 oz)     Height 06/06/20 0209 1.702 m ( )     Head Circumference --      Peak Flow --      Pain Score --      Pain Loc --      Pain Edu? --      Excl. in GC? --     Constitutional: Awake and alert but gravely ill in appearance. Eyes: Conjunctivae are normal.  Head: Atraumatic. Nose: No congestion/rhinnorhea. Mouth/Throat: Patient is wearing a mask. Neck: No stridor.  No meningeal signs.   Cardiovascular: Tachycardia, regular rhythm.  Poor peripheral perfusion. Respiratory: Increased respiratory effort with intercostal retractions and accessory muscle usage, "guppy breathing", thick and coarse of breath sounds throughout with a very wet sound upon auscultation Gastrointestinal: Soft and nontender. No distention.  Musculoskeletal: No lower extremity tenderness nor edema. No gross deformities of extremities. Neurologic:  Normal speech and language. No gross focal neurologic deficits are appreciated.  Skin:  Skin is warm, dry and intact.   ____________________________________________   LABS (all labs ordered are listed, but only abnormal results are displayed)  Labs Reviewed  RESP PANEL BY RT-PCR (FLU A&B, COVID) ARPGX2 - Abnormal; Notable for the following components:      Result Value   SARS Coronavirus 2 by RT PCR POSITIVE (*)    All other components within normal limits  LACTIC ACID, PLASMA - Abnormal; Notable for the following components:   Lactic Acid, Venous 2.4 (*)    All other components within normal limits  COMPREHENSIVE METABOLIC PANEL - Abnormal; Notable for the following components:   Glucose, Bld 132 (*)    All other components within normal limits  CBC WITH DIFFERENTIAL/PLATELET - Abnormal; Notable for the following components:   WBC 11.8 (*)    All other components within normal limits  APTT -  Abnormal; Notable for the following components:   aPTT 43 (*)    All other components within normal limits  BRAIN NATRIURETIC PEPTIDE - Abnormal; Notable for the following components:   B Natriuretic Peptide 1,222.8 (*)    All other components within normal limits  BLOOD GAS, VENOUS - Abnormal; Notable for the following components:   pH, Ven 7.19 (*)    pCO2, Ven 78 (*)    Bicarbonate 29.8 (*)    All other components within normal limits  TROPONIN I (HIGH SENSITIVITY) - Abnormal; Notable for the following components:   Troponin I (High Sensitivity) 19 (*)    All other components within normal limits  CULTURE, BLOOD (ROUTINE X 2)  CULTURE, BLOOD (ROUTINE X 2)  URINE CULTURE  PROTIME-INR  PROCALCITONIN  LACTIC  ACID, PLASMA  URINALYSIS, COMPLETE (UACMP) WITH MICROSCOPIC  C-REACTIVE PROTEIN  FERRITIN  D-DIMER, QUANTITATIVE (NOT AT [facility])  TROPONIN I (HIGH SENSITIVITY)   ____________________________________________  EKG  ED ECG REPORT I, Loleta Rose, the attending physician, personally viewed and interpreted this ECG.  Date: 06/06/2020 EKG Time: 2:10 AM Rate: 103 Rhythm: Sinus tachycardia QRS Axis: normal Intervals: Bundle branch block ST/T Wave abnormalities: Non-specific ST segment / T-wave changes, but no clear evidence of acute ischemia. Narrative Interpretation: no definitive evidence of acute ischemia; does not meet STEMI criteria.   ____________________________________________  RADIOLOGY I, Loleta Rose, personally viewed and evaluated these images (plain radiographs) as part of my medical decision making, as well as reviewing the written report by the radiologist.  ED MD interpretation: Diffuse airspace disease throughout, edema versus infection  Official radiology report(s): DG Chest Port 1 View  Result Date: 06/06/2020 CLINICAL DATA:  Shortness of breath and decreased oxygenation. Two week history of shortness of breath worsening tonight. Vomiting tonight.  EXAM: PORTABLE CHEST 1 VIEW COMPARISON:  None. FINDINGS: Shallow inspiration. Heart size appears enlarged. Diffuse bilateral airspace disease in the lungs more prominent on the right. Suggestion of small pleural effusions. Changes may represent edema, aspiration, or multifocal pneumonia. IMPRESSION: Cardiac enlargement with diffuse bilateral airspace disease and small pleural effusions. Electronically Signed   By: Burman Nieves M.D.   On: 06/06/2020 02:45    ____________________________________________   PROCEDURES   Procedure(s) performed (including Critical Care):  .Critical Care Performed by: Loleta Rose, MD Authorized by: Loleta Rose, MD   Critical care provider statement:    Critical care time (minutes):  60   Critical care time was exclusive of:  Separately billable procedures and treating other patients   Critical care was necessary to treat or prevent imminent or life-threatening deterioration of the following conditions:  Respiratory failure   Critical care was time spent personally by me on the following activities:  Development of treatment plan with patient or surrogate, discussions with consultants, evaluation of patient's response to treatment, examination of patient, obtaining history from patient or surrogate, ordering and performing treatments and interventions, ordering and review of laboratory studies, ordering and review of radiographic studies, pulse oximetry, re-evaluation of patient's condition and review of old charts .1-3 Lead EKG Interpretation Performed by: Loleta Rose, MD Authorized by: Loleta Rose, MD     Interpretation: normal     ECG rate:  95   ECG rate assessment: normal     Rhythm: sinus rhythm     Ectopy: none     Conduction: normal       ____________________________________________   INITIAL IMPRESSION / MDM / ASSESSMENT AND PLAN / ED COURSE  As part of my medical decision making, I reviewed the following data within the electronic  MEDICAL RECORD NUMBER History obtained from family, Nursing notes reviewed and incorporated, Labs reviewed , EKG interpreted , Old chart reviewed, Radiograph reviewed , Discussed with admitting physician  and Notes from prior ED visits   Differential diagnosis includes, but is not limited to, pneumonia including possibility of aspiration, CHF, COPD, ACS, PE, COVID-19.  The patient is on the cardiac monitor to evaluate for evidence of arrhythmia and/or significant heart rate changes.  The patient appears an imminent risk of loss of airway and/or cardiac arrest.  On 15 L of oxygen his SPO2 is in the 70s.  He just had an episode of vomiting he arrived and I suspect he aspirated and that using the BiPAP may cause additional aspiration.  I asked the patient if he knows his CODE STATUS and if he has filled out DNR paperwork and he said that he has.  I asked him to clarify and he said that he would not want to be intubated.  This is a critical question and he gave me permission to contact his daughter Gavin Pound.  I will call her while he is being settled then.  BiPAP is at the bedside if this is our best option, otherwise we are preparing for intubation.     Clinical Course as of 06/06/20 0443  Fri Jun 06, 2020  0214 Spoke with daughter Tressie Stalker.  She confirmed with the patient said, that he is DNR/DNI, and that he has the paperwork in his room.  I explained that he is critically ill and may die without intubation and she understands and does want me to honor his wishes.  I went back to the room and once again explained to him that he may stop breathing and he confirmed again with ED staff in the room as witnesses that he does not want to be intubated and does not want chest compressions or CPR.  I explained to the daughter and the patient that our best and only option at this time is to start him on BiPAP, which has the risk of additional aspiration since he just recently vomited, but it is my only option  to support his breathing.  Both of them indicated that they understand [CF]  0233 Discussed grave prognosis with patient's daughter who is also his healthcare power of attorney as well as her husband in person after they arrived a few minutes ago.  She is currently at the bedside with the patient.  She agreed with the plan to continue at least shortly with sepsis protocol but will strongly consider moving to comfort measures rather than aggressively treating him. [CF]  0248 Blood gas, venous(!!) Substantial acidosis and hypercarbia on VBG [CF]  0248 DG Chest [facility] I personally reviewed the patient's imaging and agree with the radiologist's interpretation that there is diffuse airspace disease throughout [CF]  0321 Lab work is much more reassuring than I initially anticipated.  Comprehensive metabolic panel is normal with no kidney dysfunction.  High-sensitivity troponin is only slightly elevated.  Coagulation studies are essentially normal.  There is only a very mild leukocytosis of 11.8 and his hemoglobin is normal.  The most significant abnormality is the elevation of his BNP at over 1200.  It is very probable that he has been developing a CHF exacerbation over the last few weeks which became acutely worse tonight.  He may still have an element of pneumonitis or aspiration pneumonia but the primary issue seems to be the hypoxemia and hypercapnia which is led to very respiratory failure.  I reassessed him and he is looking and feeling better on the BiPAP.  He has not had any additional vomiting.  His family also commented that he is looking much better.  We talked about it and agreed to treat for CHF exacerbation (his daughter added the additional history that he suffered from Takotsubo's, and even though he was unaware that this meant that he has CHF, I looked in the medical record and he has an ejection fraction estimated to be between 30 and 35%).  I am ordering furosemide 40 mg IV and the  insertion of a Foley catheter to measure strict inputs and outputs.  I will contact the hospitalist service for admission. [CF]  [phone]  SARS Coronavirus 2 by RT PCR(!): POSITIVE [CF]  0345 Discussed case by phone with Dr. Para March.  We discussed the mixed picture of acute CHF exacerbation as well as COVID-19.  She will take over care.  I ordered remdesivir and will defer the rest of the evaluation and management to Dr. Para March.  I also updated the family about the COVID-19 diagnosis as well as our charge nurse [CF]    Clinical Course User Index [CF] Loleta Rose, MD     ____________________________________________  FINAL CLINICAL IMPRESSION(S) / ED DIAGNOSES  Final diagnoses:  Acute respiratory failure with hypoxia and hypercapnia (HCC)  Acute on chronic congestive heart failure, unspecified heart failure type (HCC)  Elevated lactic acid level  DNR (do not resuscitate)  COVID-19     MEDICATIONS GIVEN DURING THIS VISIT:  Medications  vancomycin (VANCOREADY) IVPB 1750 mg/350 mL (1,750 mg Intravenous New Bag/Given 06/06/20 0332)  remdesivir 200 mg in sodium chloride 0.9% 250 mL IVPB (200 mg Intravenous New Bag/Given 06/06/20 0432)    Followed by  remdesivir 100 mg in sodium chloride 0.9 % 100 mL IVPB (has no administration in time range)  sodium chloride flush (NS) 0.9 % injection 3 mL (has no administration in time range)  sodium chloride flush (NS) 0.9 % injection 3 mL (has no administration in time range)  0.9 %  sodium chloride infusion (has no administration in time range)  acetaminophen (TYLENOL) tablet 650 mg (has no administration in time range)  ondansetron (ZOFRAN) injection 4 mg (has no administration in time range)  enoxaparin (LOVENOX) injection 40 mg (has no administration in time range)  furosemide (LASIX) injection 40 mg (has no administration in time range)  metoprolol succinate (TOPROL-XL) 24 hr tablet 12.5 mg (has no administration in time range)  isosorbide mononitrate  (IMDUR) 24 hr tablet 30 mg (has no administration in time range)  ALPRAZolam (XANAX) tablet 0.25 mg (has no administration in time range)  albuterol (VENTOLIN HFA) 108 (90 Base) MCG/ACT inhaler 2 puff (has no administration in time range)  dexamethasone (DECADRON) injection 6 mg (6 mg Intravenous Given 06/06/20 0429)  guaiFENesin-dextromethorphan (ROBITUSSIN DM) 100-10 MG/5ML syrup 10 mL (has no administration in time range)  chlorpheniramine-HYDROcodone (TUSSIONEX) 10-8 MG/5ML suspension 5 mL (has no administration in time range)  ascorbic acid (VITAMIN C) tablet 500 mg (has no administration in time range)  zinc sulfate capsule 220 mg (has no administration in time range)  ceFEPIme (MAXIPIME) 2 g in sodium chloride 0.9 % 100 mL IVPB (0 g Intravenous Stopped 06/06/20 0306)  lactated ringers bolus 1,000 mL (0 mLs Intravenous Stopped 06/06/20 0306)  furosemide (LASIX) injection 40 mg (40 mg Intravenous Given 06/06/20 0407)     ED Discharge Orders    None      *Please note:  Douglas Vance was evaluated in Emergency Department on 06/06/2020 for the symptoms described in the history of present illness. He was evaluated in the context of the global COVID-19 pandemic, which necessitated consideration that the patient might be at risk for infection with the SARS-CoV-2 virus that causes COVID-19. Institutional protocols and algorithms that pertain to the evaluation of patients at risk for COVID-19 are in a state of rapid change based on information released by regulatory bodies including the CDC and federal and state organizations. These policies and algorithms were followed during the patient's care in the ED.  Some ED evaluations and interventions may be delayed as a result of limited staffing during and after the pandemic.*  Note:  This document was prepared using Dragon voice recognition software and may include unintentional dictation errors.   Loleta RoseForbach, Lourene Hoston, MD 06/06/20 [phone]

## 2020-06-06 NOTE — Consult Note (Signed)
Cardiology Consultation:   Patient ID: Douglas Vance MRN: [id number]; DOB: February 04, 1935  Admit date: 06/06/2020 Date of Consult: 06/06/2020  PCP:  Almetta Lovely, [facility] Physician requesting consult: Dr. Para March Reason for consult: Respiratory distress, CHF   Patient Profile:   Douglas Vance is a 86 y.o. male with a hx of  hx of hypertension, MI/CAD (s/p PCI x 9) dating back to 2000, former smoker, history of GI bleeds, gastritis, nosebleeds, off Plavix, presenting with acute respiratory distress, COVID pneumonia-positive  History of Present Illness:   Douglas Vance reports developing worsening shortness of breath over the past 2 weeks, acutely worse yesterday evening, with episode of emesis on arrival to the emergency room, concern for mild aspiration, Treated with albuterol nebulizers, steroids on route At home 75% oxygen on room air, placed on 15 L nonrebreather for transport Presented with family, they confirmed he was DNR, did not want intubation He was admitted placed on BiPAP, mouth suctioning, given IV Lasix Started on vancomycin, cefepime for suspected COVID-pneumonia Code sepsis initiated Started on remdesivir  Chest x-ray cardiac enlargement with diffuse bilateral airspace disease and small pleural effusions  He denies any chest pain concerning for angina  Other outpatient cardiac work-up, Nonischemic Myoview February 2022 Echocardiogram confirming ejection fraction 30 to 35% February 2022    Past Medical History:  Diagnosis Date  . BPH (benign prostatic hyperplasia)   . Coronary artery disease   . Hypertension   . Myocardial infarction [facility])     Past Surgical History:  Procedure Laterality Date  . CARDIAC CATHETERIZATION    . VASECTOMY       Home Medications:  Prior to Admission medications   Medication Sig Start Date End Date Taking? Authorizing Provider  albuterol (VENTOLIN HFA) 108 (90 Base) MCG/ACT inhaler Inhale 1 puff into the lungs every 4  (four) hours. 05/22/20  Yes [provider]  aspirin EC 81 MG tablet Take 81 mg by mouth daily. Swallow whole.   Yes [provider]  donepezil (ARICEPT) 5 MG tablet Take 5 mg by mouth at bedtime.   Yes [provider]  isosorbide mononitrate (IMDUR) 30 MG 24 hr tablet Take 1 tablet (30 mg total) by mouth daily. 03/18/20 06/16/20 Yes Agbor-Etang, Arlys John, MD  levothyroxine (SYNTHROID) 50 MCG tablet Take 50 mcg by mouth every morning. 04/14/20  Yes [provider]  lovastatin (MEVACOR) 20 MG tablet Take 20 mg by mouth daily with supper.   Yes [provider]  metoprolol succinate (TOPROL-XL) 25 MG 24 hr tablet Take 0.5 tablets (12.5 mg total) by mouth daily. 03/27/20  Yes Agbor-Etang, Arlys John, MD  omeprazole (PRILOSEC) 20 MG capsule Take 20 mg by mouth daily.   Yes [provider]  sertraline (ZOLOFT) 50 MG tablet Take 50 mg by mouth daily. 04/14/20  Yes [provider]  tamsulosin (FLOMAX) 0.4 MG CAPS capsule Take 0.4 mg by mouth daily.   Yes [provider]  traZODone (DESYREL) 50 MG tablet Take 25-50 mg by mouth at bedtime. 04/14/20  Yes [provider]  VITAMIN D PO Take 2,000 Int'l Units/day by mouth daily.   Yes [provider]    Inpatient Medications: Scheduled Meds: . albuterol  2 puff Inhalation Q6H  . vitamin C  500 mg Oral Daily  . dexamethasone (DECADRON) injection  6 mg Intravenous Q24H  . enoxaparin (LOVENOX) injection  40 mg Subcutaneous Q24H  . furosemide  40 mg Intravenous BID  . isosorbide mononitrate  30 mg Oral Daily  .  metoprolol succinate  12.5 mg Oral Daily  . sodium chloride flush  3 mL Intravenous Q12H  . zinc sulfate  220 mg Oral Daily   Continuous Infusions: . sodium chloride    . [START ON 06/07/2020] remdesivir 100 mg in NS 100 mL     PRN Meds: sodium chloride, acetaminophen, ALPRAZolam, chlorpheniramine-HYDROcodone, guaiFENesin-dextromethorphan, ondansetron (ZOFRAN) IV, sodium  chloride flush  Allergies:    Allergies  Allergen Reactions  . Cipro [Ciprofloxacin Hcl]     Social History:   Social History   Socioeconomic History  . Marital status: Widowed    Spouse name: Not on file  . Number of children: Not on file  . Years of education: Not on file  . Highest education level: Not on file  Occupational History  . Not on file  Tobacco Use  . Smoking status: Former Games developermoker  . Smokeless tobacco: Never Used  Vaping Use  . Vaping Use: Never used  Substance and Sexual Activity  . Alcohol use: Not Currently  . Drug use: Never  . Sexual activity: Not on file  Other Topics Concern  . Not on file  Social History Narrative  . Not on file   Social Determinants of Health   Financial Resource Strain: Not on file  Food Insecurity: Not on file  Transportation Needs: Not on file  Physical Activity: Not on file  Stress: Not on file  Social Connections: Not on file  Intimate Partner Violence: Not on file    Family History:   History reviewed. No pertinent family history.   ROS:  Please see the history of present illness.  Review of Systems  Constitutional: Negative.   HENT: Negative.   Respiratory: Positive for cough, sputum production and shortness of breath.   Cardiovascular: Negative.   Gastrointestinal: Negative.   Musculoskeletal: Negative.   Neurological: Negative.   Psychiatric/Behavioral: Negative.   All other systems reviewed and are negative.   Physical Exam/Data:   Vitals:   06/06/20 1000 06/06/20 1300 06/06/20 1445 06/06/20 1500  BP: (!) 163/134 (!) 151/73 (!) 156/63 (!) 146/68  Pulse: 78 85 81 77  Resp: (!) 27 (!) 25 (!) 28 (!) 25  Temp:      TempSrc:      SpO2: 100% 93% 98% 97%  Weight:      Height:        Intake/Output Summary (Last 24 hours) at 06/06/2020 1705 Last data filed at 06/06/2020 0535 Gross per 24 hour  Intake 1429.39 ml  Output 700 ml  Net 729.39 ml   Last 3 Weights 06/06/2020 05/12/2020 03/27/2020  Weight  (lbs) 162 lb 11.2 oz 165 lb 165 lb  Weight (kg) 73.8 kg 74.844 kg 74.844 kg     Body mass index is 25.48 kg/m.  General:  Well nourished, well developed, in no acute distress HEENT: normal Lymph: no adenopathy Neck: no JVD Endocrine:  No thryomegaly Vascular: No carotid bruits; FA pulses 2+ bilaterally without bruits  Cardiac:  normal S1, S2; RRR; no murmur  Lungs: Coarse breath sounds bilaterally Abd: soft, nontender, no hepatomegaly  Ext: no edema Musculoskeletal:  No deformities, BUE and BLE strength normal and equal Skin: warm and dry  Neuro:  CNs 2-12 intact, no focal abnormalities noted Psych:  Normal affect   EKG:  The EKG was personally reviewed and demonstrates:   Normal sinus rhythm with rate 103 left bundle branch block  Telemetry:  Telemetry was personally reviewed and demonstrates:   Normal sinus rhythm  Relevant  CV Studies:  1. Left ventricular ejection fraction, by estimation, is 30 to 35%. The  left ventricle has moderately decreased function. The left ventricle  demonstrates global hypokinesis, dyschrony noted secondary to bundle  branch block. The left ventricular internal  cavity size was mildly to moderately dilated. Left ventricular diastolic  parameters are consistent with Grade I diastolic dysfunction (impaired  relaxation).  2. Right ventricular systolic function is normal. The right ventricular  size is normal. There is normal pulmonary artery systolic pressure.  3. Left atrial size was moderately dilated.  4. Mild mitral valve regurgitation.  5. The aortic valve was not well visualized. Aortic valve regurgitation  is mild to moderate. Mild to moderate aortic valve sclerosis/calcification  is present, without any evidence of aortic stenosis.   Laboratory Data:  High Sensitivity Troponin:   Recent Labs  Lab 06/06/20 0214 06/06/20 0427  TROPONINIHS 19* 105*     Chemistry Recent Labs  Lab 06/06/20 0214  NA 139  K 3.6  CL 106  CO2  26  GLUCOSE 132*  BUN 11  CREATININE 1.00  CALCIUM 8.9  GFRNONAA >60  ANIONGAP 7    Recent Labs  Lab 06/06/20 0214  PROT 8.1  ALBUMIN 4.1  AST 16  ALT 12  ALKPHOS 71  BILITOT 0.8   Hematology Recent Labs  Lab 06/06/20 0214  WBC 11.8*  RBC 5.01  HGB 15.2  HCT 46.8  MCV 93.4  MCH 30.3  MCHC 32.5  RDW 12.7  PLT 240   BNP Recent Labs  Lab 06/06/20 0214  BNP 1,222.8*    DDimer  Recent Labs  Lab 06/06/20 0514  DDIMER 2.10*     Radiology/Studies:  DG Chest Port 1 View  Result Date: 06/06/2020 CLINICAL DATA:  Shortness of breath and decreased oxygenation. Two week history of shortness of breath worsening tonight. Vomiting tonight. EXAM: PORTABLE CHEST 1 VIEW COMPARISON:  None. FINDINGS: Shallow inspiration. Heart size appears enlarged. Diffuse bilateral airspace disease in the lungs more prominent on the right. Suggestion of small pleural effusions. Changes may represent edema, aspiration, or multifocal pneumonia. IMPRESSION: Cardiac enlargement with diffuse bilateral airspace disease and small pleural effusions. Electronically Signed   By: Burman Nieves M.D.   On: 06/06/2020 02:45     Assessment and Plan:   1. Acute respiratory distress Multifactorial including COVID-pneumonia, CHF, COPD, ischemic cardiomyopathy, uncontrolled hypertension  unable to exclude exacerbation in the emergency room from aspiration -DNR/DNI, BiPAP started in the emergency room, also on nebulizers, steroids, remdesivir, Lasix -He did receive 1 L lactated Ringer's at 2 AM, would moderate any IV fluids given concern for CHF symptoms -Good urine output on IV Lasix x1, scheduled to receive additional dose 6 PM, now off BiPAP on nasal cannula oxygen -Blood pressure is elevated, can reinitiate his outpatient cardiac regimen Start Entresto 24/26 mg twice a day, metoprolol succinate 12.5 mg daily, -IV Lasix twice daily with close monitoring of renal function  COVID-19 pneumonia On  steroids, remdesivir Family at the bedside denies any sick contacts Reports he is vaccinated  CAD with chronic stable angina Minimally elevated troponin 105 in the setting of respiratory distress hypoxia known coronary disease -No plan for ischemic work-up at this time On beta-blocker, continue aspirin, high intensity statin  Hyperlipidemia Lipids ordered for the a.m., no recent labs in the [facility] system Lovastatin not on formulary, start Lipitor 40    Total encounter time more than 110 minutes  Greater than 50% was spent in counseling and coordination  of care with the patient   For questions or updates, please contact [facility] Please consult [email or web address] for contact info under    Signed, Julien Nordmann, MD  06/06/2020 5:05 PM

## 2020-06-06 NOTE — ED Notes (Signed)
Dr. York Cerise at bedside with pt

## 2020-06-07 DIAGNOSIS — I5023 Acute on chronic systolic (congestive) heart failure: Secondary | ICD-10-CM | POA: Diagnosis not present

## 2020-06-07 DIAGNOSIS — R778 Other specified abnormalities of plasma proteins: Secondary | ICD-10-CM | POA: Diagnosis not present

## 2020-06-07 DIAGNOSIS — I2510 Atherosclerotic heart disease of native coronary artery without angina pectoris: Secondary | ICD-10-CM | POA: Diagnosis not present

## 2020-06-07 DIAGNOSIS — I25118 Atherosclerotic heart disease of native coronary artery with other forms of angina pectoris: Secondary | ICD-10-CM | POA: Diagnosis not present

## 2020-06-07 DIAGNOSIS — E872 Acidosis: Secondary | ICD-10-CM

## 2020-06-07 DIAGNOSIS — Z8719 Personal history of other diseases of the digestive system: Secondary | ICD-10-CM | POA: Diagnosis not present

## 2020-06-07 DIAGNOSIS — J9601 Acute respiratory failure with hypoxia: Secondary | ICD-10-CM | POA: Diagnosis not present

## 2020-06-07 DIAGNOSIS — R7989 Other specified abnormal findings of blood chemistry: Secondary | ICD-10-CM

## 2020-06-07 LAB — BASIC METABOLIC PANEL
Anion gap: 10 (ref 5–15)
BUN: 21 mg/dL (ref 8–23)
CO2: 27 mmol/L (ref 22–32)
Calcium: 9 mg/dL (ref 8.9–10.3)
Chloride: 100 mmol/L (ref 98–111)
Creatinine, Ser: 1.02 mg/dL (ref 0.61–1.24)
GFR, Estimated: >60 mL/min (ref >60)
Glucose, Bld: 116 mg/dL — ABNORMAL HIGH (ref 70–99)
Potassium: 4.1 mmol/L (ref 3.5–5.1)
Sodium: 137 mmol/L (ref 135–145)

## 2020-06-07 LAB — URINE CULTURE: Culture: NO GROWTH

## 2020-06-07 LAB — C-REACTIVE PROTEIN: CRP: 0.8 mg/dL (ref <1.0)

## 2020-06-07 NOTE — Progress Notes (Signed)
Progress Note  Patient Name: Douglas Vance Date of Encounter: 06/07/2020  [facility] Cardiologist: Debbe Odea, MD   Subjective   Shortness of breath improved.  Currently on high flow nasal oxygen, granddaughter at bedside.  Inpatient Medications    Scheduled Meds: . albuterol  2 puff Inhalation Q6H  . vitamin C  500 mg Oral Daily  . aspirin EC  81 mg Oral Daily  . atorvastatin  40 mg Oral Daily  . Chlorhexidine Gluconate Cloth  6 each Topical Daily  . dexamethasone (DECADRON) injection  6 mg Intravenous Q24H  . enoxaparin (LOVENOX) injection  40 mg Subcutaneous Q24H  . furosemide  40 mg Intravenous BID  . metoprolol succinate  12.5 mg Oral Daily  . sodium chloride flush  3 mL Intravenous Q12H  . zinc sulfate  220 mg Oral Daily   Continuous Infusions: . sodium chloride    . ampicillin-sulbactam (UNASYN) IV 3 g (06/07/20 1411)  . remdesivir 100 mg in NS 100 mL Stopped (06/07/20 0959)   PRN Meds: sodium chloride, acetaminophen, ALPRAZolam, chlorpheniramine-HYDROcodone, guaiFENesin-dextromethorphan, ondansetron (ZOFRAN) IV, sodium chloride flush   Vital Signs    Vitals:   06/07/20 1200 06/07/20 1300 06/07/20 1400 06/07/20 1439  BP: (!) 123/58 (!) 123/54 139/73   Pulse: 78 95 77   Resp: 13 (!) 21 (!) 23   Temp:  98.1 F (36.7 C)    TempSrc:  Axillary    SpO2: 96% 92% 97% 99%  Weight:      Height:        Intake/Output Summary (Last 24 hours) at 06/07/2020 1538 Last data filed at 06/07/2020 1000 Gross per 24 hour  Intake 406 ml  Output 1700 ml  Net -1294 ml   Last 3 Weights 06/07/2020 06/06/2020 05/12/2020  Weight (lbs) 155 lb 13.8 oz 162 lb 11.2 oz 165 lb  Weight (kg) 70.7 kg 73.8 kg 74.844 kg      Telemetry    Sinus rhythm, heart rate 83- Personally Reviewed  ECG    No new tracing obtained- Personally Reviewed  Physical Exam   GEN: No acute distress.   Neck: No JVD Cardiac: RRR Respiratory:  Diminished breath sounds at bases  bilaterally GI: Soft, nontender, non-distended  MS: No edema; No deformity. Neuro:  Nonfocal  Psych: Normal affect   Labs    High Sensitivity Troponin:   Recent Labs  Lab 06/06/20 0214 06/06/20 0427 06/06/20 1845 06/06/20 2103  TROPONINIHS 19* 105* 238* 258*      Chemistry Recent Labs  Lab 06/06/20 0214 06/07/20 0519  NA 139 137  K 3.6 4.1  CL 106 100  CO2 26 27  GLUCOSE 132* 116*  BUN 11 21  CREATININE 1.00 1.02  CALCIUM 8.9 9.0  PROT 8.1  --   ALBUMIN 4.1  --   AST 16  --   ALT 12  --   ALKPHOS 71  --   BILITOT 0.8  --   GFRNONAA >60 >60  ANIONGAP 7 10     Hematology Recent Labs  Lab 06/06/20 0214  WBC 11.8*  RBC 5.01  HGB 15.2  HCT 46.8  MCV 93.4  MCH 30.3  MCHC 32.5  RDW 12.7  PLT 240    BNP Recent Labs  Lab 06/06/20 0214  BNP 1,222.8*     DDimer  Recent Labs  Lab 06/06/20 0514  DDIMER 2.10*     Radiology    CT ANGIO CHEST PE W OR WO CONTRAST  Result Date:  06/06/2020 CLINICAL DATA:  Shortness of breath EXAM: CT ANGIOGRAPHY CHEST WITH CONTRAST TECHNIQUE: Multidetector CT imaging of the chest was performed using the standard protocol during bolus administration of intravenous contrast. Multiplanar CT image reconstructions and MIPs were obtained to evaluate the vascular anatomy. CONTRAST:  OMNIPAQUE IOHEXOL 350 MG/ML SOLN COMPARISON:  None. FINDINGS: Cardiovascular: Satisfactory opacification of the pulmonary arteries to the segmental level. No evidence of pulmonary embolism. Mild cardiomegaly. No pericardial effusion. Coronary artery atherosclerosis. Thoracic aortic atherosclerosis. Mediastinum/Nodes: No enlarged mediastinal, hilar, or axillary lymph nodes. Thyroid gland, trachea, and esophagus demonstrate no significant findings. Lungs/Pleura: Small bilateral pleural effusions. Bilateral calcified pleural plaques as can be seen with prior asbestos exposure or infection. Nodular soft tissue pleural masses along the left lateral chest  wall measuring 2.4 x 1.2 cm 0.7 x 1.8 cm respectively. Bilateral lower lobe, right middle lobe and bilateral upper lobe interstitial and alveolar airspace disease most concerning for multilobar pneumonia including atypical viral pneumonia such as COVID 19. No pneumothorax. Upper Abdomen: No acute abnormality. Musculoskeletal: No acute osseous abnormality. No aggressive osseous lesion. Age-indeterminate T4 vertebral body compression fracture. Review of the MIP images confirms the above findings. IMPRESSION: 1. No evidence of pulmonary embolus. 2. Bilateral lower lobe, right middle lobe and bilateral upper lobe interstitial and alveolar airspace disease most concerning for multilobar pneumonia including atypical viral pneumonia such as COVID 19. 3. Small bilateral pleural effusions. 4. Bilateral calcified pleural plaques as can be seen with prior asbestos exposure or infection. Nodular soft tissue pleural masses along the left lateral chest wall measuring 2.4 x 1.2 cm 0.7 x 1.8 cm respectively which may reflect focal scarring versus malignancy. Recommend follow-up CT of the chest with intravenous contrast following the patient's acute illness. 5. Age-indeterminate T4 vertebral body compression fracture. 6. Aortic Atherosclerosis (ICD10-I70.0). Electronically Signed   By: Elige Ko   On: 06/06/2020 18:13   DG Chest Port 1 View  Result Date: 06/06/2020 CLINICAL DATA:  Shortness of breath and decreased oxygenation. Two week history of shortness of breath worsening tonight. Vomiting tonight. EXAM: PORTABLE CHEST 1 VIEW COMPARISON:  None. FINDINGS: Shallow inspiration. Heart size appears enlarged. Diffuse bilateral airspace disease in the lungs more prominent on the right. Suggestion of small pleural effusions. Changes may represent edema, aspiration, or multifocal pneumonia. IMPRESSION: Cardiac enlargement with diffuse bilateral airspace disease and small pleural effusions. Electronically Signed   By: Burman Nieves M.D.   On: 06/06/2020 02:45    Cardiac Studies   Echo 03/2020 1. Left ventricular ejection fraction, by estimation, is 30 to 35%. The  left ventricle has moderately decreased function. The left ventricle  demonstrates global hypokinesis, dyschrony noted secondary to bundle  branch block. The left ventricular internal  cavity size was mildly to moderately dilated. Left ventricular diastolic  parameters are consistent with Grade I diastolic dysfunction (impaired  relaxation).  2. Right ventricular systolic function is normal. The right ventricular  size is normal. There is normal pulmonary artery systolic pressure.  3. Left atrial size was moderately dilated.  4. Mild mitral valve regurgitation.  5. The aortic valve was not well visualized. Aortic valve regurgitation  is mild to moderate. Mild to moderate aortic valve sclerosis/calcification  is present, without any evidence of aortic stenosis.  Patient Profile     86 y.o. male with history of PCI x9, ischemic cardiomyopathy, EF 30 to 35%, hypertension who presented to the hospital due to shortness of breath, found to have multilobar pneumonia, also COVID-19 positive.  Being seen due to cardiomyopathy history.  Assessment & Plan    1.  Ischemic cardiomyopathy, EF 30 to 35% -Patient well-known to myself from outpatient.  BP typically runs low. -Only able to tolerate Toprol-XL and Imdur as outpatient. -Holding CHF meds due to low blood pressures, restart Imdur, Toprol-XL as BP and respiratory status improves -Etiology of shortness of breath is pulmonary, he has stable chronic cardiac disease. -No additional testing or intervention planned this admission.  2.  CAD/PCI -Troponin elevation due to demand supply mismatch. -Aspirin, Lipitor, Imdur. -Cannot tolerate DAPT due to prior bleeding issues. -Ischemic work-up not planned.  3.  Multilobar pneumonia, COVID-19 -Management as per ICU team  Total encounter time 35  minutes  Greater than 50% was spent in counseling and coordination of care with the patient     Signed, Debbe Odea, MD  06/07/2020, 3:38 PM

## 2020-06-07 NOTE — Progress Notes (Signed)
PROGRESS NOTE  Douglas Vance  XAJ:[id number] DOB: 1934-10-15 DOA: 06/06/2020 PCP: Almetta Lovely, [facility]   Brief Narrative: Douglas Vance is an 86 y.o. male with a history of CAD, chronic HFrEF (LVEF 30-35%), GI bleeding, and HTN who presented to the ED by EMS with respiratory distress, hypoxia requiring 15L NRB that had worsened over the previous couple weeks. He was hypoxic with respiratory acidosis by VBG, BNP elevated to 1222, troponin 19 > 105, and covid-19 PCR was positive. Lactic acid 2.4 > 1.5. WBC 11.8. He vomited and there was concern for aspiration pneumonia for which antibiotics were given empirically for sepsis. CXR showed bilateral airspace disease and small effusions. He was placed on BiPAP after DNR and DNI status was confirmed and respiratory status has stabilized, still requiring HHFNC in ICU. CRP noted to be normal. Treatment with IV lasix, remdesivir, steroids, and empiric antibiotics have been given.   Assessment & Plan: Principal Problem:   Acute on chronic systolic CHF (congestive heart failure) (HCC) Active Problems:   CAD (coronary artery disease)   HTN (hypertension)   History of GI bleed   Pneumonia due to COVID-19 virus   Acute respiratory failure with hypoxia (HCC)   Lactic acidosis  Acute hypoxic and hypercarbic respiratory failure: Due to acute CHF, possible contributions of covid-19 and aspiration pneumonitis/pneumonia, with tobacco use history, suspect COPD at baseline though no wheezing to suggest exacerbation at this time. PCT negative on arrival. Based on celerity of improvement, lack of inflammatory marker elevation, strongly suspect CHF is primary driver of respiratory distress. No PE on CTA chest. - Continue HHFNC, requiring 40L / 70% FiO2 currently at rest, will remain with prn BiPAP in ICU for now. He is DNR DNI.  - Diuresis as below - Covid treatment to include remdesivir, steroids - Continue BDs by inhaler. - Continue unasyn given witnessed  aspiration event in ED, though bacterial PNA is felt to be unlikely primary cause of presentation.  Acute on chronic HFrEF:  - Continue lasix 40mg  IV BID - Monitor I/O, daily weights, BMP. Having good UOP though incontinence compromising accuracy. - ?if CRT would be indicated for this patient going forward. - With soft BPs, would suggest we hold entresto, imdur for now with no angina.  Demand myocardial ischemia causing mild troponin elevation, CAD s/p PCI x9: No chest pain. BBB is preexisting. Nonischemic myoview Feb 2022. Degree of respiratory distress/hypoxia would account for troponin elevation at admission which has been trended to reassuring plateau.  - No interventions/work up scheduled per cardiology. - Continue BB, statin, ASA  Covid-19 pneumonia: CTA chest shows typical airspace opacities for covid pneumonia on my personal review which is congruent with radiologist interpretation. Unclear why CRP not higher.  - Started on remdesivir and standard dose decadron which we will continue. - Recheck CRP. Given significant findings on CTA and persistent severe hypoxemia, we may need to augment steroids. - Isolation  Nodular soft tissue pleural masses along the left lateral chest wall measuring 2.4 x 1.2 cm 0.7 x 1.8cm respectively: Scarring vs. malignancy.  - Plan contrast-enhanced CT chest after resolution of this acute illness.    DVT prophylaxis: Lovenox Code Status: DNR, DNI Family Communication: Daughter by phone today Disposition Plan:  Status is: Inpatient  Remains inpatient appropriate because:IV treatments appropriate due to intensity of illness or inability to take PO and Inpatient level of care appropriate due to severity of illness  Dispo: The patient is from: Facility  Anticipated d/c is to: TBD              Patient currently is not medically stable to d/c.   Difficult to place patient No  Consultants:   Cardiology  Procedures:    BiPAP  Antimicrobials:  Remdesivir 4/29 - 5/3  Vancomycin, cefepime 4/29  Unasyn 4/29 - 5/3   Subjective: Breathing seems better to him, no chest pain. No other pain. Denies palpitations. He's been peeing a lot per pt and RN though condom cath has leaked significantly, missing accurate UOP monitoring.  Objective: Vitals:   06/07/20 0900 06/07/20 1000 06/07/20 1100 06/07/20 1200  BP: (!) 92/54 118/66 101/73 (!) 123/58  Pulse: 76 78 79 78  Resp: 12 20 (!) 27 13  Temp:      TempSrc:      SpO2: 95% 95% 94% 96%  Weight:      Height:        Intake/Output Summary (Last 24 hours) at 06/07/2020 1313 Last data filed at 06/07/2020 1000 Gross per 24 hour  Intake 406 ml  Output 1700 ml  Net -1294 ml   Filed Weights   06/06/20 0209 06/07/20 0500  Weight: 73.8 kg 70.7 kg   ZOX:WRUEAVWGen:Elderly, much more interactive male in no distress Pulm: Tachypneic with acceptable work of breathing with HHFNC at 40LPM / 70%. Crackles diminished significantly from prior exams.  CV: Regular rate and rhythm. No murmur, rub, or gallop. No JVD, trace/minimal pedal edema. GI: Abdomen soft, non-tender, non-distended, with normoactive bowel sounds. No organomegaly or masses felt. Ext: Warm, no deformities Skin: No rashes, lesions or ulcers on visualized skin Neuro: Alert and oriented. No focal neurological deficits. Psych: Judgement and insight appear normal. Mood & affect appropriate.   Data Reviewed: I have personally reviewed following labs and imaging studies  CBC: Recent Labs  Lab 06/06/20 0214  WBC 11.8*  NEUTROABS 6.6  HGB 15.2  HCT 46.8  MCV 93.4  PLT 240   Basic Metabolic Panel: Recent Labs  Lab 06/06/20 0214 06/07/20 0519  NA 139 137  K 3.6 4.1  CL 106 100  CO2 26 27  GLUCOSE 132* 116*  BUN 11 21  CREATININE 1.00 1.02  CALCIUM 8.9 9.0   GFR: Estimated Creatinine Clearance: 51.3 mL/min (by C-G formula based on SCr of 1.02 mg/dL). Liver Function Tests: Recent Labs  Lab  06/06/20 0214  AST 16  ALT 12  ALKPHOS 71  BILITOT 0.8  PROT 8.1  ALBUMIN 4.1   No results for input(s): LIPASE, AMYLASE in the last 168 hours. No results for input(s): AMMONIA in the last 168 hours. Coagulation Profile: Recent Labs  Lab 06/06/20 0214  INR 1.0   Cardiac Enzymes: No results for input(s): CKTOTAL, CKMB, CKMBINDEX, TROPONINI in the last 168 hours. BNP (last 3 results) No results for input(s): PROBNP in the last 8760 hours. HbA1C: No results for input(s): HGBA1C in the last 72 hours. CBG: No results for input(s): GLUCAP in the last 168 hours. Lipid Profile: Recent Labs    06/06/20 1845  CHOL 130  HDL 56  LDLCALC 63  TRIG 54  CHOLHDL 2.3   Thyroid Function Tests: No results for input(s): TSH, T4TOTAL, FREET4, T3FREE, THYROIDAB in the last 72 hours. Anemia Panel: Recent Labs    06/06/20 0427  FERRITIN 29   Urine analysis:    Component Value Date/Time   COLORURINE YELLOW (A) 06/06/2020 0514   APPEARANCEUR CLEAR (A) 06/06/2020 0514   LABSPEC 1.008 06/06/2020 [id number]  PHURINE 5.0 06/06/2020 0514   GLUCOSEU NEGATIVE 06/06/2020 0514   HGBUR MODERATE (A) 06/06/2020 0514   BILIRUBINUR NEGATIVE 06/06/2020 0514   KETONESUR NEGATIVE 06/06/2020 0514   PROTEINUR NEGATIVE 06/06/2020 0514   NITRITE NEGATIVE 06/06/2020 0514   LEUKOCYTESUR NEGATIVE 06/06/2020 0514   Recent Results (from the past 240 hour(s))  Resp Panel by RT-PCR (Flu A&B, Covid) Nasopharyngeal Swab     Status: Abnormal   Collection Time: 06/06/20  2:26 AM   Specimen: Nasopharyngeal Swab; Nasopharyngeal(NP) swabs in vial transport medium  Result Value Ref Range Status   SARS Coronavirus 2 by RT PCR POSITIVE (A) NEGATIVE Final    Comment: RESULT CALLED TO, READ BACK BY AND VERIFIED WITH: HALEY HAMILTON @0334  ON 06/06/20 SKL (NOTE) SARS-CoV-2 target nucleic acids are DETECTED.  The SARS-CoV-2 RNA is generally detectable in upper respiratory specimens during the acute phase of infection.  Positive results are indicative of the presence of the identified virus, but do not rule out bacterial infection or co-infection with other pathogens not detected by the test. Clinical correlation with patient history and other diagnostic information is necessary to determine patient infection status. The expected result is Negative.  Fact Sheet for Patients: 06/08/20  Fact Sheet for Healthcare Providers: BloggerCourse.com  This test is not yet approved or cleared by the [employer] FDA and  has been authorized for detection and/or diagnosis of SARS-CoV-2 by FDA under an Emergency Use Authorization (EUA).  This EUA will remain in effect (meaning this test can  be used) for the duration of  the COVID-19 declaration under Section 564(b)(1) of the Act, 21 U.S.C. section 360bbb-3(b)(1), unless the authorization is terminated or revoked sooner.     Influenza A by PCR NEGATIVE NEGATIVE Final   Influenza B by PCR NEGATIVE NEGATIVE Final    Comment: (NOTE) The Xpert Xpress SARS-CoV-2/FLU/RSV plus assay is intended as an aid in the diagnosis of influenza from Nasopharyngeal swab specimens and should not be used as a sole basis for treatment. Nasal washings and aspirates are unacceptable for Xpert Xpress SARS-CoV-2/FLU/RSV testing.  Fact Sheet for Patients: [place]  Fact Sheet for Healthcare Providers: BloggerCourse.com  This test is not yet approved or cleared by the [employer] FDA and has been authorized for detection and/or diagnosis of SARS-CoV-2 by FDA under an Emergency Use Authorization (EUA). This EUA will remain in effect (meaning this test can be used) for the duration of the COVID-19 declaration under Section 564(b)(1) of the Act, 21 U.S.C. section 360bbb-3(b)(1), unless the authorization is terminated or revoked.  Performed at [facility],  [address] [place], [place]   Blood Culture (routine x 2)     Status: None (Preliminary result)   Collection Time: 06/06/20  2:26 AM   Specimen: BLOOD  Result Value Ref Range Status   Specimen Description BLOOD LEFT ANTECUBITAL  Final   Special Requests   Final    BOTTLES DRAWN AEROBIC AND ANAEROBIC Blood Culture adequate volume   Culture   Final    NO GROWTH 1 DAY Performed at [facility], [address], [place], [place]    Report Status PENDING  Incomplete  Blood Culture (routine x 2)     Status: None (Preliminary result)   Collection Time: 06/06/20  2:26 AM   Specimen: BLOOD  Result Value Ref Range Status   Specimen Description BLOOD BLOOD RIGHT HAND  Final   Special Requests   Final    BOTTLES DRAWN AEROBIC AND  ANAEROBIC Blood Culture adequate volume   Culture   Final    NO GROWTH 1 DAY Performed at [facility], [address] [place], [place] [address]    Report Status PENDING  Incomplete  Urine culture     Status: None   Collection Time: 06/06/20  5:14 AM   Specimen: In/Out Cath Urine  Result Value Ref Range Status   Specimen Description   Final    IN/OUT CATH URINE Performed at [facility], [address] [place], [place] [address]    Special Requests   Final    NONE Performed at [facility], [address] [place], [place] [address]    Culture   Final    NO GROWTH Performed at [facility], [address], [place], [place] [address]    Report Status 06/07/2020 FINAL  Final  MRSA PCR Screening     Status: None   Collection Time: 06/06/20  6:12 PM   Specimen: Nasal Mucosa; Nasopharyngeal  Result Value Ref Range Status   MRSA by PCR NEGATIVE NEGATIVE Final    Comment:        The GeneXpert MRSA Assay (FDA approved for NASAL specimens only), is one component of a comprehensive MRSA colonization surveillance program. It is not intended to diagnose MRSA infection nor to guide  or monitor treatment for MRSA infections. Performed at [facility], [address] [place], [place] [address]       Radiology Studies: CT ANGIO CHEST PE W OR WO CONTRAST  Result Date: 06/06/2020 CLINICAL DATA:  Shortness of breath EXAM: CT ANGIOGRAPHY CHEST WITH CONTRAST TECHNIQUE: Multidetector CT imaging of the chest was performed using the standard protocol during bolus administration of intravenous contrast. Multiplanar CT image reconstructions and MIPs were obtained to evaluate the vascular anatomy. CONTRAST:  OMNIPAQUE IOHEXOL 350 MG/ML SOLN COMPARISON:  None. FINDINGS: Cardiovascular: Satisfactory opacification of the pulmonary arteries to the segmental level. No evidence of pulmonary embolism. Mild cardiomegaly. No pericardial effusion. Coronary artery atherosclerosis. Thoracic aortic atherosclerosis. Mediastinum/Nodes: No enlarged mediastinal, hilar, or axillary lymph nodes. Thyroid gland, trachea, and esophagus demonstrate no significant findings. Lungs/Pleura: Small bilateral pleural effusions. Bilateral calcified pleural plaques as can be seen with prior asbestos exposure or infection. Nodular soft tissue pleural masses along the left lateral chest wall measuring 2.4 x 1.2 cm 0.7 x 1.8 cm respectively. Bilateral lower lobe, right middle lobe and bilateral upper lobe interstitial and alveolar airspace disease most concerning for multilobar pneumonia including atypical viral pneumonia such as COVID 19. No pneumothorax. Upper Abdomen: No acute abnormality. Musculoskeletal: No acute osseous abnormality. No aggressive osseous lesion. Age-indeterminate T4 vertebral body compression fracture. Review of the MIP images confirms the above findings. IMPRESSION: 1. No evidence of pulmonary embolus. 2. Bilateral lower lobe, right middle lobe and bilateral upper lobe interstitial and alveolar airspace disease most concerning for multilobar pneumonia including atypical viral pneumonia such  as COVID 19. 3. Small bilateral pleural effusions. 4. Bilateral calcified pleural plaques as can be seen with prior asbestos exposure or infection. Nodular soft tissue pleural masses along the left lateral chest wall measuring 2.4 x 1.2 cm 0.7 x 1.8 cm respectively which may reflect focal scarring versus malignancy. Recommend follow-up CT of the chest with intravenous contrast following the patient's acute illness. 5. Age-indeterminate T4 vertebral body compression fracture. 6. Aortic Atherosclerosis (ICD10-I70.0). Electronically Signed   By: Elige Ko   On: 06/06/2020 18:13   DG Chest Port 1 [address]  Result Date: 06/06/2020 CLINICAL DATA:  Shortness of breath and decreased oxygenation. Two week history of shortness of breath worsening tonight. Vomiting tonight. EXAM: PORTABLE CHEST 1 VIEW COMPARISON:  None. FINDINGS: Shallow inspiration. Heart size appears enlarged. Diffuse bilateral airspace disease in the lungs more prominent on the right. Suggestion of small pleural effusions. Changes may represent edema, aspiration, or multifocal pneumonia. IMPRESSION: Cardiac enlargement with diffuse bilateral airspace disease and small pleural effusions. Electronically Signed   By: Burman Nieves M.D.   On: 06/06/2020 02:45    Scheduled Meds: . albuterol  2 puff Inhalation Q6H  . vitamin C  500 mg Oral Daily  . aspirin EC  81 mg Oral Daily  . atorvastatin  40 mg Oral Daily  . Chlorhexidine Gluconate Cloth  6 each Topical Daily  . dexamethasone (DECADRON) injection  6 mg Intravenous Q24H  . enoxaparin (LOVENOX) injection  40 mg Subcutaneous Q24H  . furosemide  40 mg Intravenous BID  . isosorbide mononitrate  30 mg Oral Daily  . metoprolol succinate  12.5 mg Oral Daily  . sodium chloride flush  3 mL Intravenous Q12H  . zinc sulfate  220 mg Oral Daily   Continuous Infusions: . sodium chloride    . ampicillin-sulbactam (UNASYN) IV Stopped (06/07/20 0839)  . remdesivir 100 mg in NS 100 mL Stopped  (06/07/20 0959)     LOS: 1 day   Time spent: 35 minutes.  Tyrone Nine, MD [facility] [email or web address] 06/07/2020, 1:13 PM

## 2020-06-08 DIAGNOSIS — Z8719 Personal history of other diseases of the digestive system: Secondary | ICD-10-CM | POA: Diagnosis not present

## 2020-06-08 DIAGNOSIS — I25118 Atherosclerotic heart disease of native coronary artery with other forms of angina pectoris: Secondary | ICD-10-CM | POA: Diagnosis not present

## 2020-06-08 DIAGNOSIS — I5023 Acute on chronic systolic (congestive) heart failure: Secondary | ICD-10-CM | POA: Diagnosis not present

## 2020-06-08 DIAGNOSIS — J9601 Acute respiratory failure with hypoxia: Secondary | ICD-10-CM | POA: Diagnosis not present

## 2020-06-08 DIAGNOSIS — I2510 Atherosclerotic heart disease of native coronary artery without angina pectoris: Secondary | ICD-10-CM | POA: Diagnosis not present

## 2020-06-08 LAB — COMPREHENSIVE METABOLIC PANEL
ALT: 12 U/L (ref 0–44)
AST: 21 U/L (ref 15–41)
Albumin: 3.4 g/dL — ABNORMAL LOW (ref 3.5–5.0)
Alkaline Phosphatase: 47 U/L (ref 38–126)
Anion gap: 9 (ref 5–15)
BUN: 29 mg/dL — ABNORMAL HIGH (ref 8–23)
CO2: 31 mmol/L (ref 22–32)
Calcium: 8.7 mg/dL — ABNORMAL LOW (ref 8.9–10.3)
Chloride: 101 mmol/L (ref 98–111)
Creatinine, Ser: 0.75 mg/dL (ref 0.61–1.24)
GFR, Estimated: >60 mL/min (ref >60)
Glucose, Bld: 110 mg/dL — ABNORMAL HIGH (ref 70–99)
Potassium: 3.6 mmol/L (ref 3.5–5.1)
Sodium: 141 mmol/L (ref 135–145)
Total Bilirubin: 0.8 mg/dL (ref 0.3–1.2)
Total Protein: 6.6 g/dL (ref 6.5–8.1)

## 2020-06-08 LAB — CBC
HCT: 40.7 % (ref 39.0–52.0)
Hemoglobin: 13.7 g/dL (ref 13.0–17.0)
MCH: 30.8 pg (ref 26.0–34.0)
MCHC: 33.7 g/dL (ref 30.0–36.0)
MCV: 91.5 fL (ref 80.0–100.0)
Platelets: 182 K/uL (ref 150–400)
RBC: 4.45 MIL/uL (ref 4.22–5.81)
RDW: 12.6 % (ref 11.5–15.5)
WBC: 9.4 K/uL (ref 4.0–10.5)
nRBC: 0 % (ref 0.0–0.2)

## 2020-06-08 LAB — GLUCOSE, CAPILLARY: Glucose-Capillary: 111 mg/dL — ABNORMAL HIGH (ref 70–99)

## 2020-06-08 MED ORDER — POTASSIUM CHLORIDE CRYS ER 20 MEQ PO TBCR
20.0000 meq | EXTENDED_RELEASE_TABLET | Freq: Once | ORAL | Status: AC
  Administered 2020-06-08: 20 meq via ORAL
  Filled 2020-06-08: qty 1

## 2020-06-08 MED ORDER — ISOSORBIDE MONONITRATE ER 30 MG PO TB24
30.0000 mg | ORAL_TABLET | Freq: Every day | ORAL | Status: DC
  Administered 2020-06-08 – 2020-06-11 (×4): 30 mg via ORAL
  Filled 2020-06-08 (×4): qty 1

## 2020-06-08 MED ORDER — AMOXICILLIN-POT CLAVULANATE 875-125 MG PO TABS
1.0000 | ORAL_TABLET | Freq: Two times a day (BID) | ORAL | Status: AC
  Administered 2020-06-08 – 2020-06-10 (×6): 1 via ORAL
  Filled 2020-06-08 (×6): qty 1

## 2020-06-08 NOTE — Evaluation (Signed)
Physical Therapy Evaluation Patient Details Name: Douglas Vance MRN: [id number] DOB: 1934-08-09 Today's Date: 06/08/2020   History of Present Illness  Douglas Vance is an 86 y.o. male with a history of CAD, chronic HFrEF (LVEF 30-35%), GI bleeding, and HTN who presented to the ED by EMS with respiratory distress, hypoxia requiring 15L NRB that had worsened over the previous couple weeks. He was hypoxic with respiratory acidosis by VBG, BNP elevated to 1222, troponin 19 > 105, and covid-19 PCR was positive. Lactic acid 2.4 > 1.5. WBC 11.8. He vomited and there was concern for aspiration pneumonia for which antibiotics were given empirically for sepsis. CXR showed bilateral airspace disease and small effusions.  Clinical Impression  Pt seen for PT evaluation with pt very eager to get OOB & ambulate - anticipate pt can ambulate increased distances but limited on this date 2/2 short HFNC tubing. Pt is able to perform marching while standing EOB & ambulate multiple short laps in room with min/mod assist 2/2 scissoring gait. Anticipate pt will demonstrate improved balance when able to walk longer distances in more linear fashion, may also benefit from trialing [facility] but pt eager to return to independent PLOF without AD. Will continue to follow pt acutely to address deficits noted to allow pt to return to PLOF.     Follow Up Recommendations Home health PT;Supervision for mobility/OOB    Equipment Recommendations  Rolling walker with 5" wheels    Recommendations for Other Services       Precautions / Restrictions Precautions Precautions: Fall Restrictions Weight Bearing Restrictions: No      Mobility  Bed Mobility Overal bed mobility: Needs Assistance Bed Mobility: Supine to Sit     Supine to sit: Supervision;HOB elevated          Transfers Overall transfer level: Needs assistance   Transfers: Sit to/from Stand Sit to Stand: Min assist         General transfer comment:  posterior lean back onto bed with BLE  Ambulation/Gait Ambulation/Gait assistance: Min assist;Mod assist Gait Distance (Feet): 25 Feet (multiple laps beside bed for as long of a distance as HFNC tubing would allow) Assistive device: None Gait Pattern/deviations: Scissoring     General Gait Details: min assist for gait except mod assist for occasional LOB 2/2 scissoring gait (potentially due to walking small distance & completing frequent turns)  Careers information officer    Modified Rankin (Stroke Patients Only)       Balance Overall balance assessment: Needs assistance Sitting-balance support: No upper extremity supported;Feet supported Sitting balance-Leahy Scale: Good     Standing balance support: No upper extremity supported Standing balance-Leahy Scale: Fair Standing balance comment: static standing without UE support with CGA/min assist                             Pertinent Vitals/Pain Pain Assessment: No/denies pain    Home Living Family/patient expects to be discharged to:: Assisted living               Home Equipment: Cane - quad (rollator)      Prior Function Level of Independence: Independent         Comments: Independent without AD & wants to remain that way as long as possible. Walks to the grocery store at least 1x/week.     Hand Dominance        Extremity/Trunk Assessment  Upper Extremity Assessment Upper Extremity Assessment: Overall WFL for tasks assessed    Lower Extremity Assessment Lower Extremity Assessment: Generalized weakness       Communication   Communication: No difficulties  Cognition Arousal/Alertness: Awake/alert Behavior During Therapy: WFL for tasks assessed/performed Overall Cognitive Status: Within Functional Limits for tasks assessed                                 General Comments: Very pleasant, talkative man.      General Comments General comments (skin  integrity, edema, etc.): Pt on 10L/min HFNC & SpO2 >90% throughout, pt using incentive spirometer upon PT arrival, max HR 90 bpm during session    Exercises Other Exercises Other Exercises: pt performed marching in place several times while standing EOB with min assist.   Assessment/Plan    PT Assessment Patient needs continued PT services  PT Problem List Decreased strength;Decreased mobility;Decreased balance;Cardiopulmonary status limiting activity       PT Treatment Interventions DME instruction;Therapeutic activities;Gait training;Therapeutic exercise;Patient/family education;Stair training;Balance training;Neuromuscular re-education;Functional mobility training    PT Goals (Current goals can be found in the Care Plan section)  Acute Rehab PT Goals Patient Stated Goal: return to PLOF PT Goal Formulation: With patient Time For Goal Achievement: 06/22/20 Potential to Achieve Goals: Good    Frequency Min 2X/week   Barriers to discharge        Co-evaluation               AM-PAC PT "6 Clicks" Mobility  Outcome Measure Help needed turning from your back to your side while in a flat bed without using bedrails?: None Help needed moving from lying on your back to sitting on the side of a flat bed without using bedrails?: None Help needed moving to and from a bed to a chair (including a wheelchair)?: A Little Help needed standing up from a chair using your arms (e.g., wheelchair or bedside chair)?: A Little Help needed to walk in hospital room?: A Little Help needed climbing 3-5 steps with a railing? : A Little 6 Click Score: 20    End of Session Equipment Utilized During Treatment: Oxygen Activity Tolerance: Patient tolerated treatment well Patient left: in chair;with call bell/phone within reach Nurse Communication: Mobility status PT Visit Diagnosis: Unsteadiness on feet (R26.81);Muscle weakness (generalized) (M62.81)    Time: 8676-7209 PT Time Calculation (min)  (ACUTE ONLY): 25 min   Charges:   PT Evaluation $PT Eval Low Complexity: 1 Low PT Treatments $Therapeutic Activity: 8-22 mins        [name], PT, DPT 06/08/20, 12:13 PM   [name] 06/08/2020, 12:11 PM

## 2020-06-08 NOTE — Progress Notes (Signed)
PROGRESS NOTE  Douglas Vance  UJW:[id number] DOB: 1934/06/22 DOA: 06/06/2020 PCP: Almetta Lovely, [facility]   Brief Narrative: Douglas Vance is an 86 y.o. male with a history of CAD, chronic HFrEF (LVEF 30-35%), GI bleeding, and HTN who presented to the ED by EMS with respiratory distress, hypoxia requiring 15L NRB that had worsened over the previous couple weeks. He was hypoxic with respiratory acidosis by VBG, BNP elevated to 1222, troponin 19 > 105, and covid-19 PCR was positive. Lactic acid 2.4 > 1.5. WBC 11.8. He vomited and there was concern for aspiration pneumonia for which antibiotics were given empirically for sepsis. CXR showed bilateral airspace disease and small effusions. He was placed on BiPAP after DNR and DNI status was confirmed and respiratory status has stabilized, still requiring HHFNC in ICU. CRP noted to be normal. Treatment with IV lasix, remdesivir, steroids, and empiric antibiotics have been given.   Assessment & Plan: Principal Problem:   Acute on chronic systolic CHF (congestive heart failure) (HCC) Active Problems:   CAD (coronary artery disease)   HTN (hypertension)   History of GI bleed   Pneumonia due to COVID-19 virus   Acute respiratory failure with hypoxia (HCC)   Lactic acidosis   Elevated troponin  Acute hypoxic and hypercarbic respiratory failure: Due to acute CHF, covid-19 pneumonia, and aspiration pneumonitis/pneumonia, with tobacco use history, suspect COPD at baseline though no wheezing to suggest exacerbation at this time. PCT negative on arrival. Based on celerity of improvement, lack of inflammatory marker elevation, strongly suspect CHF is primary driver of respiratory distress. No PE on CTA chest. - Continue HHFNC > wean to HFNC as tolerated. Remain prn BiPAP in ICU for now. He is DNR DNI.  - Diuresis as below - Covid treatment to include remdesivir, steroids - Continue BDs by inhaler. - Continue unasyn given witnessed aspiration event in  ED, though bacterial PNA is felt to be unlikely primary cause of presentation.  Acute on chronic HFrEF:  - Continue lasix  IV BID - Monitor I/O, daily weights, BMP. Creatinine continues to improve, with widening of BUN:Cr and robust UOP (when urine output isn't missed in incontinence/leaking).  - With dyssynchrony on echo, LBBB, ?if CRT would be indicated for this patient going forward. - With soft BPs, would suggest we hold entresto, imdur for now with no angina.  Demand myocardial ischemia causing mild troponin elevation, CAD s/p PCI x9: No chest pain. BBB is preexisting. Nonischemic myoview Feb 2022. Degree of respiratory distress/hypoxia would account for troponin elevation at admission which has been trended to reassuring plateau.  - No interventions/work up scheduled per cardiology. - Continue BB, statin, ASA  Covid-19 pneumonia: CTA chest shows typical airspace opacities for covid pneumonia on my personal review which is congruent with radiologist interpretation. Unclear why CRP not higher.  - Started on remdesivir (plan 4/29 - 5/3) and standard dose decadron which we will continue. - CRP remains wnl not suggestive of cytokine storm. Maintain decadron dosing. - Remain on isolation.  - FV, IS, consider proning trial if unable to wean oxygen.  Nodular soft tissue pleural masses along the left lateral chest wall measuring 2.4 x 1.2 cm 0.7 x 1.8cm respectively: Scarring vs. malignancy.  - Plan contrast-enhanced CT chest after resolution of this acute illness.    DVT prophylaxis: Lovenox Code Status: DNR, DNI Family Communication: Daughter by phone daily Disposition Plan:  Status is: Inpatient  Remains inpatient appropriate because:IV treatments appropriate due to intensity of illness or inability to take PO  and Inpatient level of care appropriate due to severity of illness  Dispo: The patient is from: Facility              Anticipated d/c is to: TBD              Patient  currently is not medically stable to d/c.   Difficult to place patient No  Consultants:   Cardiology  Procedures:   BiPAP  Antimicrobials:  Remdesivir 4/29 - 5/3  Vancomycin, cefepime 4/29  Unasyn 4/29 - 5/3   Subjective: Feels stronger today, eating well, breathing more easily. Has not been able to come down on oxygen yet. No chest pain. No leg swelling. Urine output this morning subjectively less than yesterday.  Objective: Vitals:   06/08/20 0600 06/08/20 0700 06/08/20 0800 06/08/20 0840  BP: 127/60 123/85 (!) 138/58   Pulse: 68 64 66 73  Resp: 13 17 14 12   Temp:    97.7 F (36.5 C)  TempSrc:    Oral  SpO2: 99% 99% 100% 99%  Weight:      Height:        Intake/Output Summary (Last 24 hours) at 06/08/2020 0859 Last data filed at 06/08/2020 0500 Gross per 24 hour  Intake 1378.45 ml  Output 2275 ml  Net -896.55 ml   Filed Weights   06/06/20 0209 06/07/20 0500 06/08/20 0500  Weight: 73.8 kg 70.7 kg 70 kg   Gen: Alert, pleasant, WDWN male in no distress Pulm: Nonlabored breathing HHFNC, crackles diffuse, no wheezes. CV: Regular rate and rhythm. No murmur, rub, or gallop. No JVD, no dependent edema. GI: Abdomen soft, non-tender, non-distended, with normoactive bowel sounds.  Ext: Warm, no deformities Skin: No new rashes, lesions or ulcers on visualized skin. Neuro: Alert and oriented. No focal neurological deficits. Psych: Judgement and insight appear fair. Mood euthymic & affect congruent. Behavior is appropriate.    Data Reviewed: I have personally reviewed following labs and imaging studies  CBC: Recent Labs  Lab 06/06/20 0214 06/08/20 0425  WBC 11.8* 9.4  NEUTROABS 6.6  --   HGB 15.2 13.7  HCT 46.8 40.7  MCV 93.4 91.5  PLT 240 182   Basic Metabolic Panel: Recent Labs  Lab 06/06/20 0214 06/07/20 0519 06/08/20 0425  NA 139 137 141  K 3.6 4.1 3.6  CL 106 100 101  CO2 26 27 31   GLUCOSE 132* 116* 110*  BUN 11 21 29*  CREATININE 1.00 1.02 0.75   CALCIUM 8.9 9.0 8.7*   GFR: Estimated Creatinine Clearance: 65.4 mL/min (by C-G formula based on SCr of 0.75 mg/dL). Liver Function Tests: Recent Labs  Lab 06/06/20 0214 06/08/20 0425  AST 16 21  ALT 12 12  ALKPHOS 71 47  BILITOT 0.8 0.8  PROT 8.1 6.6  ALBUMIN 4.1 3.4*   No results for input(s): LIPASE, AMYLASE in the last 168 hours. No results for input(s): AMMONIA in the last 168 hours. Coagulation Profile: Recent Labs  Lab 06/06/20 0214  INR 1.0   Cardiac Enzymes: No results for input(s): CKTOTAL, CKMB, CKMBINDEX, TROPONINI in the last 168 hours. BNP (last 3 results) No results for input(s): PROBNP in the last 8760 hours. HbA1C: No results for input(s): HGBA1C in the last 72 hours. CBG: Recent Labs  Lab 06/06/20 1809  GLUCAP 111*   Lipid Profile: Recent Labs    06/06/20 1845  CHOL 130  HDL 56  LDLCALC 63  TRIG 54  CHOLHDL 2.3   Thyroid Function Tests: No results  for input(s): TSH, T4TOTAL, FREET4, T3FREE, THYROIDAB in the last 72 hours. Anemia Panel: Recent Labs    06/06/20 0427  FERRITIN 29   Urine analysis:    Component Value Date/Time   COLORURINE YELLOW (A) 06/06/2020 0514   APPEARANCEUR CLEAR (A) 06/06/2020 0514   LABSPEC 1.008 06/06/2020 0514   PHURINE 5.0 06/06/2020 0514   GLUCOSEU NEGATIVE 06/06/2020 0514   HGBUR MODERATE (A) 06/06/2020 0514   BILIRUBINUR NEGATIVE 06/06/2020 0514   KETONESUR NEGATIVE 06/06/2020 0514   PROTEINUR NEGATIVE 06/06/2020 0514   NITRITE NEGATIVE 06/06/2020 0514   LEUKOCYTESUR NEGATIVE 06/06/2020 0514   Recent Results (from the past 240 hour(s))  Resp Panel by RT-PCR (Flu A&B, Covid) Nasopharyngeal Swab     Status: Abnormal   Collection Time: 06/06/20  2:26 AM   Specimen: Nasopharyngeal Swab; Nasopharyngeal(NP) swabs in vial transport medium  Result Value Ref Range Status   SARS Coronavirus 2 by RT PCR POSITIVE (A) NEGATIVE Final    Comment: RESULT CALLED TO, READ BACK BY AND VERIFIED WITH: HALEY  HAMILTON @0334  ON 06/06/20 SKL (NOTE) SARS-CoV-2 target nucleic acids are DETECTED.  The SARS-CoV-2 RNA is generally detectable in upper respiratory specimens during the acute phase of infection. Positive results are indicative of the presence of the identified virus, but do not rule out bacterial infection or co-infection with other pathogens not detected by the test. Clinical correlation with patient history and other diagnostic information is necessary to determine patient infection status. The expected result is Negative.  Fact Sheet for Patients: 06/08/20  Fact Sheet for Healthcare Providers: BloggerCourse.com  This test is not yet approved or cleared by the [employer]ious[employer].[place] FDA and  has been authorized for detection and/or diagnosis of SARS-CoV-2 by FDA under an Emergency Use Authorization (EUA).  This EUA will remain in effect (meaning this test can  be used) for the duration of  the COVID-19 declaration under Section 564(b)(1) of the Act, 21 U.S.C. section 360bbb-3(b)(1), unless the authorization is terminated or revoked sooner.     Influenza A by PCR NEGATIVE NEGATIVE Final   Influenza B by PCR NEGATIVE NEGATIVE Final    Comment: (NOTE) The Xpert Xpress SARS-CoV-2/FLU/RSV plus assay is intended as an aid in the diagnosis of influenza from Nasopharyngeal swab specimens and should not be used as a sole basis for treatment. Nasal washings and aspirates are unacceptable for Xpert Xpress SARS-CoV-2/FLU/RSV testing.  Fact Sheet for Patients: [place]  Fact Sheet for Healthcare Providers: BloggerCourse.com  This test is not yet approved or cleared by the [employer]ious[employer]ker.[employer] FDA and has been authorized for detection and/or diagnosis of SARS-CoV-2 by FDA under an Emergency Use Authorization (EUA). This EUA will remain in effect (meaning this test can be used) for the  duration of the COVID-19 declaration under Section 564(b)(1) of the Act, 21 U.S.C. section 360bbb-3(b)(1), unless the authorization is terminated or revoked.  Performed at [facility], [address] [place], [place]   Blood Culture (routine x 2)     Status: None (Preliminary result)   Collection Time: 06/06/20  2:26 AM   Specimen: BLOOD  Result Value Ref Range Status   Specimen Description BLOOD LEFT ANTECUBITAL  Final   Special Requests   Final    BOTTLES DRAWN AEROBIC AND ANAEROBIC Blood Culture adequate volume   Culture   Final    NO GROWTH 2 DAYS Performed at [facility], [address], [place], [place]    Report Status PENDING  Incomplete  Blood Culture (routine x 2)     Status: None (Preliminary result)   Collection Time: 06/06/20  2:26 AM   Specimen: BLOOD  Result Value Ref Range Status   Specimen Description BLOOD BLOOD RIGHT HAND  Final   Special Requests   Final    BOTTLES DRAWN AEROBIC AND ANAEROBIC Blood Culture adequate volume   Culture   Final    NO GROWTH 2 DAYS Performed at [facility], [address], [place], [place] [address]    Report Status PENDING  Incomplete  Urine culture     Status: None   Collection Time: 06/06/20  5:14 AM   Specimen: In/Out Cath Urine  Result Value Ref Range Status   Specimen Description   Final    IN/OUT CATH URINE Performed at [facility], [address], [place], [place] [address]    Special Requests   Final    NONE Performed at [facility], [address], [place], [place] [address]    Culture   Final    NO GROWTH Performed at [facility], [address] [place], [place] [address]    Report Status 06/07/2020 FINAL  Final  MRSA PCR Screening     Status: None   Collection Time: 06/06/20  6:12 PM   Specimen: Nasal Mucosa; Nasopharyngeal  Result Value Ref Range Status   MRSA by PCR NEGATIVE NEGATIVE Final    Comment:        The  GeneXpert MRSA Assay (FDA approved for NASAL specimens only), is one component of a comprehensive MRSA colonization surveillance program. It is not intended to diagnose MRSA infection nor to guide or monitor treatment for MRSA infections. Performed at [facility], [address] [place], [place] [address]       Radiology Studies: CT ANGIO CHEST PE W OR WO CONTRAST  Result Date: 06/06/2020 CLINICAL DATA:  Shortness of breath EXAM: CT ANGIOGRAPHY CHEST WITH CONTRAST TECHNIQUE: Multidetector CT imaging of the chest was performed using the standard protocol during bolus administration of intravenous contrast. Multiplanar CT image reconstructions and MIPs were obtained to evaluate the vascular anatomy. CONTRAST:  OMNIPAQUE IOHEXOL 350 MG/ML SOLN COMPARISON:  None. FINDINGS: Cardiovascular: Satisfactory opacification of the pulmonary arteries to the segmental level. No evidence of pulmonary embolism. Mild cardiomegaly. No pericardial effusion. Coronary artery atherosclerosis. Thoracic aortic atherosclerosis. Mediastinum/Nodes: No enlarged mediastinal, hilar, or axillary lymph nodes. Thyroid gland, trachea, and esophagus demonstrate no significant findings. Lungs/Pleura: Small bilateral pleural effusions. Bilateral calcified pleural plaques as can be seen with prior asbestos exposure or infection. Nodular soft tissue pleural masses along the left lateral chest wall measuring 2.4 x 1.2 cm 0.7 x 1.8 cm respectively. Bilateral lower lobe, right middle lobe and bilateral upper lobe interstitial and alveolar airspace disease most concerning for multilobar pneumonia including atypical viral pneumonia such as COVID 19. No pneumothorax. Upper Abdomen: No acute abnormality. Musculoskeletal: No acute osseous abnormality. No aggressive osseous lesion. Age-indeterminate T4 vertebral body compression fracture. Review of the MIP images confirms the above findings. IMPRESSION: 1. No evidence of pulmonary  embolus. 2. Bilateral lower lobe, right middle lobe and bilateral upper lobe interstitial and alveolar airspace disease most concerning for multilobar pneumonia including atypical viral pneumonia such as COVID 19. 3. Small bilateral pleural effusions. 4. Bilateral calcified pleural plaques as can be seen with prior asbestos exposure or infection. Nodular soft tissue pleural masses along the left lateral chest wall measuring 2.4 x 1.2 cm 0.7 x 1.8  cm respectively which may reflect focal scarring versus malignancy. Recommend follow-up CT of the chest with intravenous contrast following the patient's acute illness. 5. Age-indeterminate T4 vertebral body compression fracture. 6. Aortic Atherosclerosis (ICD10-I70.0). Electronically Signed   By: Elige KoHetal  Patel   On: 06/06/2020 18:13    Scheduled Meds: . albuterol  2 puff Inhalation Q6H  . vitamin C  500 mg Oral Daily  . aspirin EC  81 mg Oral Daily  . atorvastatin  40 mg Oral Daily  . Chlorhexidine Gluconate Cloth  6 each Topical Daily  . dexamethasone (DECADRON) injection  6 mg Intravenous Q24H  . enoxaparin (LOVENOX) injection  40 mg Subcutaneous Q24H  . furosemide  40 mg Intravenous BID  . metoprolol succinate  12.5 mg Oral Daily  . sodium chloride flush  3 mL Intravenous Q12H  . zinc sulfate  220 mg Oral Daily   Continuous Infusions: . sodium chloride    . ampicillin-sulbactam (UNASYN) IV 3 g (06/08/20 [id number])  . remdesivir 100 mg in NS 100 mL Stopped (06/07/20 0959)     LOS: 2 days   Time spent: 35 minutes.  Tyrone Nineyan B Ariona Deschene, MD [facility] [email or web address] 06/08/2020, 8:59 AM

## 2020-06-08 NOTE — Progress Notes (Signed)
Progress Note  Patient Name: Douglas Vance Date of Encounter: 06/08/2020  [facility] Cardiologist: Debbe Odea, MD   Subjective   Shortness of breath improved.  Off high flow nasal oxygen, currently on nasal cannula.  Breathing is much improved  Inpatient Medications    Scheduled Meds: . albuterol  2 puff Inhalation Q6H  . amoxicillin-clavulanate  1 tablet Oral Q12H  . vitamin C  500 mg Oral Daily  . aspirin EC  81 mg Oral Daily  . atorvastatin  40 mg Oral Daily  . Chlorhexidine Gluconate Cloth  6 each Topical Daily  . dexamethasone (DECADRON) injection  6 mg Intravenous Q24H  . enoxaparin (LOVENOX) injection  40 mg Subcutaneous Q24H  . furosemide  40 mg Intravenous BID  . isosorbide mononitrate  30 mg Oral Daily  . sodium chloride flush  3 mL Intravenous Q12H  . zinc sulfate  220 mg Oral Daily   Continuous Infusions: . sodium chloride    . remdesivir 100 mg in NS 100 mL 100 mg (06/08/20 1045)   PRN Meds: sodium chloride, acetaminophen, ALPRAZolam, chlorpheniramine-HYDROcodone, guaiFENesin-dextromethorphan, ondansetron (ZOFRAN) IV, sodium chloride flush   Vital Signs    Vitals:   06/08/20 0900 06/08/20 0906 06/08/20 1000 06/08/20 1100  BP: (!) 144/61  (!) 125/59 (!) 155/57  Pulse: 82  76 78  Resp: (!) 22  (!) 24 19  Temp:      TempSrc:      SpO2: 98% 98% 98% 99%  Weight:      Height:        Intake/Output Summary (Last 24 hours) at 06/08/2020 1523 Last data filed at 06/08/2020 1100 Gross per 24 hour  Intake 898.52 ml  Output 2075 ml  Net -1176.48 ml   Last 3 Weights 06/08/2020 06/07/2020 06/06/2020  Weight (lbs) 154 lb 5.2 oz 155 lb 13.8 oz 162 lb 11.2 oz  Weight (kg) 70 kg 70.7 kg 73.8 kg      Telemetry    Sinus rhythm- Personally Reviewed  ECG    No new tracing obtained- Personally Reviewed  Physical Exam   GEN: No acute distress.   Neck: No JVD Cardiac: RRR Respiratory:  Diminished breath sounds at bases bilaterally GI: Soft,  nontender, non-distended  MS: No edema; No deformity. Neuro:  Nonfocal  Psych: Normal affect   Labs    High Sensitivity Troponin:   Recent Labs  Lab 06/06/20 0214 06/06/20 0427 06/06/20 1845 06/06/20 2103  TROPONINIHS 19* 105* 238* 258*      Chemistry Recent Labs  Lab 06/06/20 0214 06/07/20 0519 06/08/20 0425  NA 139 137 141  K 3.6 4.1 3.6  CL 106 100 101  CO2 26 27 31   GLUCOSE 132* 116* 110*  BUN 11 21 29*  CREATININE 1.00 1.02 0.75  CALCIUM 8.9 9.0 8.7*  PROT 8.1  --  6.6  ALBUMIN 4.1  --  3.4*  AST 16  --  21  ALT 12  --  12  ALKPHOS 71  --  47  BILITOT 0.8  --  0.8  GFRNONAA >60 >60 >60  ANIONGAP 7 10 9      Hematology Recent Labs  Lab 06/06/20 0214 06/08/20 0425  WBC 11.8* 9.4  RBC 5.01 4.45  HGB 15.2 13.7  HCT 46.8 40.7  MCV 93.4 91.5  MCH 30.3 30.8  MCHC 32.5 33.7  RDW 12.7 12.6  PLT 240 182    BNP Recent Labs  Lab 06/06/20 0214  BNP 1,222.8*  DDimer  Recent Labs  Lab 06/06/20 0514  DDIMER 2.10*     Radiology    CT ANGIO CHEST PE W OR WO CONTRAST  Result Date: 06/06/2020 CLINICAL DATA:  Shortness of breath EXAM: CT ANGIOGRAPHY CHEST WITH CONTRAST TECHNIQUE: Multidetector CT imaging of the chest was performed using the standard protocol during bolus administration of intravenous contrast. Multiplanar CT image reconstructions and MIPs were obtained to evaluate the vascular anatomy. CONTRAST:  OMNIPAQUE IOHEXOL 350 MG/ML SOLN COMPARISON:  None. FINDINGS: Cardiovascular: Satisfactory opacification of the pulmonary arteries to the segmental level. No evidence of pulmonary embolism. Mild cardiomegaly. No pericardial effusion. Coronary artery atherosclerosis. Thoracic aortic atherosclerosis. Mediastinum/Nodes: No enlarged mediastinal, hilar, or axillary lymph nodes. Thyroid gland, trachea, and esophagus demonstrate no significant findings. Lungs/Pleura: Small bilateral pleural effusions. Bilateral calcified pleural plaques as can be  seen with prior asbestos exposure or infection. Nodular soft tissue pleural masses along the left lateral chest wall measuring 2.4 x 1.2 cm 0.7 x 1.8 cm respectively. Bilateral lower lobe, right middle lobe and bilateral upper lobe interstitial and alveolar airspace disease most concerning for multilobar pneumonia including atypical viral pneumonia such as COVID 19. No pneumothorax. Upper Abdomen: No acute abnormality. Musculoskeletal: No acute osseous abnormality. No aggressive osseous lesion. Age-indeterminate T4 vertebral body compression fracture. Review of the MIP images confirms the above findings. IMPRESSION: 1. No evidence of pulmonary embolus. 2. Bilateral lower lobe, right middle lobe and bilateral upper lobe interstitial and alveolar airspace disease most concerning for multilobar pneumonia including atypical viral pneumonia such as COVID 19. 3. Small bilateral pleural effusions. 4. Bilateral calcified pleural plaques as can be seen with prior asbestos exposure or infection. Nodular soft tissue pleural masses along the left lateral chest wall measuring 2.4 x 1.2 cm 0.7 x 1.8 cm respectively which may reflect focal scarring versus malignancy. Recommend follow-up CT of the chest with intravenous contrast following the patient's acute illness. 5. Age-indeterminate T4 vertebral body compression fracture. 6. Aortic Atherosclerosis (ICD10-I70.0). Electronically Signed   By: Elige Ko   On: 06/06/2020 18:13    Cardiac Studies   Echo 03/2020 1. Left ventricular ejection fraction, by estimation, is 30 to 35%. The  left ventricle has moderately decreased function. The left ventricle  demonstrates global hypokinesis, dyschrony noted secondary to bundle  branch block. The left ventricular internal  cavity size was mildly to moderately dilated. Left ventricular diastolic  parameters are consistent with Grade I diastolic dysfunction (impaired  relaxation).  2. Right ventricular systolic function is  normal. The right ventricular  size is normal. There is normal pulmonary artery systolic pressure.  3. Left atrial size was moderately dilated.  4. Mild mitral valve regurgitation.  5. The aortic valve was not well visualized. Aortic valve regurgitation  is mild to moderate. Mild to moderate aortic valve sclerosis/calcification  is present, without any evidence of aortic stenosis.  Patient Profile     86 y.o. male with history of PCI x9, ischemic cardiomyopathy, EF 30 to 35%, hypertension who presented to the hospital due to shortness of breath, found to have multilobar pneumonia, also COVID-19 positive.  Being seen due to cardiomyopathy history.  Assessment & Plan    1.  Ischemic cardiomyopathy, EF 30 to 35% -Blood pressure improved -Restart Toprol-XL, Imdur. -No additional testing or intervention planned this admission. -No additional cardiac testing or intervention planned.  Close follow-up as outpatient.  2.  CAD/PCI -Troponin elevation due to demand supply mismatch. -Patient is asymptomatic, stable from a cardiac perspective -  Aspirin, Lipitor, Imdur. -Cannot tolerate DAPT due to prior bleeding issues.  3.  Multilobar pneumonia, COVID-19 -Management as per ICU team  Total encounter time 35 minutes  Greater than 50% was spent in counseling and coordination of care with the patient No further cardiac intervention or testing planned this admission.  Close follow-up as outpatient.  Cardiology will sign off.     Signed, Debbe Odea, MD  06/08/2020, 3:23 PM

## 2020-06-09 ENCOUNTER — Encounter: Payer: Self-pay | Admitting: Internal Medicine

## 2020-06-09 ENCOUNTER — Other Ambulatory Visit: Payer: Self-pay

## 2020-06-09 DIAGNOSIS — J9601 Acute respiratory failure with hypoxia: Secondary | ICD-10-CM | POA: Diagnosis not present

## 2020-06-09 DIAGNOSIS — I25118 Atherosclerotic heart disease of native coronary artery with other forms of angina pectoris: Secondary | ICD-10-CM | POA: Diagnosis not present

## 2020-06-09 DIAGNOSIS — Z8719 Personal history of other diseases of the digestive system: Secondary | ICD-10-CM | POA: Diagnosis not present

## 2020-06-09 DIAGNOSIS — I5023 Acute on chronic systolic (congestive) heart failure: Secondary | ICD-10-CM | POA: Diagnosis not present

## 2020-06-09 DIAGNOSIS — E440 Moderate protein-calorie malnutrition: Secondary | ICD-10-CM | POA: Insufficient documentation

## 2020-06-09 LAB — COMPREHENSIVE METABOLIC PANEL
ALT: 19 U/L (ref 0–44)
AST: 28 U/L (ref 15–41)
Albumin: 3.4 g/dL — ABNORMAL LOW (ref 3.5–5.0)
Alkaline Phosphatase: 40 U/L (ref 38–126)
Anion gap: 9 (ref 5–15)
BUN: 28 mg/dL — ABNORMAL HIGH (ref 8–23)
CO2: 29 mmol/L (ref 22–32)
Calcium: 8.6 mg/dL — ABNORMAL LOW (ref 8.9–10.3)
Chloride: 100 mmol/L (ref 98–111)
Creatinine, Ser: 0.73 mg/dL (ref 0.61–1.24)
GFR, Estimated: >60 mL/min (ref >60)
Glucose, Bld: 97 mg/dL (ref 70–99)
Potassium: 3.4 mmol/L — ABNORMAL LOW (ref 3.5–5.1)
Sodium: 138 mmol/L (ref 135–145)
Total Bilirubin: 1.1 mg/dL (ref 0.3–1.2)
Total Protein: 6.4 g/dL — ABNORMAL LOW (ref 6.5–8.1)

## 2020-06-09 MED ORDER — POTASSIUM CHLORIDE CRYS ER 20 MEQ PO TBCR
40.0000 meq | EXTENDED_RELEASE_TABLET | Freq: Once | ORAL | Status: AC
  Administered 2020-06-09: 40 meq via ORAL
  Filled 2020-06-09: qty 2

## 2020-06-09 MED ORDER — TRAZODONE HCL 50 MG PO TABS
25.0000 mg | ORAL_TABLET | Freq: Every day | ORAL | Status: DC
  Administered 2020-06-09 – 2020-06-10 (×2): 25 mg via ORAL
  Filled 2020-06-09 (×2): qty 1

## 2020-06-09 MED ORDER — ENSURE MAX PROTEIN PO LIQD
11.0000 [oz_av] | Freq: Two times a day (BID) | ORAL | Status: DC
  Administered 2020-06-09 – 2020-06-11 (×5): 11 [oz_av] via ORAL
  Filled 2020-06-09: qty 330

## 2020-06-09 MED ORDER — LEVOTHYROXINE SODIUM 50 MCG PO TABS
50.0000 ug | ORAL_TABLET | Freq: Every day | ORAL | Status: DC
  Administered 2020-06-09 – 2020-06-11 (×3): 50 ug via ORAL
  Filled 2020-06-09 (×3): qty 1

## 2020-06-09 MED ORDER — PNEUMOCOCCAL VAC POLYVALENT 25 MCG/0.5ML IJ INJ
0.5000 mL | INJECTION | INTRAMUSCULAR | Status: AC
  Administered 2020-06-11: 0.5 mL via INTRAMUSCULAR
  Filled 2020-06-09: qty 0.5

## 2020-06-09 MED ORDER — ADULT MULTIVITAMIN W/MINERALS CH
1.0000 | ORAL_TABLET | Freq: Every day | ORAL | Status: DC
  Administered 2020-06-10 – 2020-06-11 (×2): 1 via ORAL
  Filled 2020-06-09 (×2): qty 1

## 2020-06-09 NOTE — TOC Initial Note (Addendum)
Transition of Care [facility]) - Initial/Assessment Note    Patient Details  Name: Douglas Vance MRN: [id number] Date of Birth: [birth year]  Transition of Care [facility]) CM/SW Contact:    Marina Goodell Phone Number:  [phone] 06/09/2020, 11:55 AM  Clinical Narrative:                  Patient presents to [facility] due to SOB and emesis upon arrival to ED.  Patient is currently a resident at [facility], and moved to [place] from [place] in Jun 04, 2019 after the death of his wife in 10/04/2019.  CSW spoke with patient's daughter Tressie Stalker [phone].  CSW explained the role of TOC in patient care and the home health recommendations from PT.  Ms. Dimas Aguas stated the patient will return home with her while he finishes COVID+ quarantine and gains his strength.  CSW sent referral to [facility] w/ [facility] for PT, RN and they will provide [facility] services.  CSW spoke with [facility] w/ update on patient's d/c plan. TOC will continue to follow  Expected Discharge Plan: Home w Home Health Services Barriers to Discharge: Continued Medical Work up   Patient Goals and CMS Choice Patient states their goals for this hospitalization and ongoing recovery are:: To regain strength and return to ALF. CMS Medicare.gov Compare Post Acute Care list provided to:: Other (Comment Required) Choice offered to / list presented to : Adult Children Tressie Stalker (Daughter)   [phone])  Expected Discharge Plan and Services Expected Discharge Plan: Home w Home Health Services In-house Referral: Clinical Social Work   Post Acute Care Choice: Home Health Living arrangements for the past 2 months: Assisted Living Facility [place])                           HH Arranged: PT,RN [facility] Agency: [facility] ([facility] Date HH Agency Contacted: 06/09/20 Time HH Agency Contacted: 1155 Representative spoke with at [facility] Agency: Barbara Cower  Prior Living Arrangements/Services Living arrangements for the  past 2 months: [facility] Lives with:: Facility Resident Patient language and need for interpreter reviewed:: Yes Do you feel safe going back to the place where you live?: Yes      Need for Family Participation in Patient Care: Yes (Comment) Care giver support system in place?: Yes (comment)   Criminal Activity/Legal Involvement Pertinent to Current Situation/Hospitalization: No - Comment as needed  Activities of Daily Living      Permission Sought/Granted Permission sought to share information with : Facility Medical sales representative Permission granted to share information with : Yes, Verbal Permission Granted  Share Information with NAME: Tressie Stalker (Daughter)   [phone]           Emotional Assessment Appearance:: Appears stated age Attitude/Demeanor/Rapport: Engaged Affect (typically observed): Stable Orientation: : Oriented to Self,Oriented to Situation,Oriented to Place Alcohol / Substance Use: Not Applicable Psych Involvement: No (comment)  Admission diagnosis:  DNR (do not resuscitate) [Z66] Elevated lactic acid level [R79.89] Acute respiratory failure with hypoxia and hypercapnia (HCC) [J96.01, J96.02] Acute respiratory failure with hypoxemia (HCC) [J96.01] Acute on chronic congestive heart failure, unspecified heart failure type (HCC) [I50.9] COVID-19 [U07.1] Patient Active Problem List   Diagnosis Date Noted  . Elevated troponin   . CAD (coronary artery disease) 06/06/2020  . HTN (hypertension) 06/06/2020  . History of GI bleed 06/06/2020  . Pneumonia due to COVID-19 virus 06/06/2020  . Acute respiratory failure with hypoxia (  HCC) 06/06/2020  . Acute respiratory failure with hypoxemia (HCC) 06/06/2020  . Acute on chronic systolic CHF (congestive heart failure) (HCC) 06/06/2020  . Lactic acidosis 06/06/2020   PCP:  [employer]:   [facility] [address] [place] - [address] [place] [address] Phone: [phone] Fax: [phone]  [facility] - [place], [place] - [address] [place] [address] Phone: [phone] Fax: [phone]     Social Determinants of Health (SDOH) Interventions    Readmission Risk Interventions No flowsheet data found.

## 2020-06-09 NOTE — Plan of Care (Addendum)
Patient is alert and oriented x 4. Patient reports that he is not in any pain and states that he "feels good". Patient has maintained good urine output. Patient is now on room air and maintaining oxygen saturations in the 90's with a goal of 88% or higher. Patient is currently asleep. Patient stated that he did not want to receive a bath or the CHG wipes. Will continue patient care through the shift.   Update 06/09/2020 2343  Patient placed on 2L nasal cannula due to oxygen saturation dropping to the 70's while sleeping. Notified Dr. Arville Care, new goal of maintaining oxygen saturations at 93% or higher noted.  CPAP will be initiated if patient drops below 93%. Will continue patient care.   Problem: Education: Goal: Knowledge of General Education information will improve Description: Including pain rating scale, medication(s)/side effects and non-pharmacologic comfort measures Outcome: Progressing   Problem: Health Behavior/Discharge Planning: Goal: Ability to manage health-related needs will improve Outcome: Progressing   Problem: Clinical Measurements: Goal: Ability to maintain clinical measurements within normal limits will improve Outcome: Progressing Goal: Will remain free from infection Outcome: Progressing Goal: Diagnostic test results will improve Outcome: Progressing Goal: Respiratory complications will improve Outcome: Progressing Goal: Cardiovascular complication will be avoided Outcome: Progressing   Problem: Activity: Goal: Risk for activity intolerance will decrease Outcome: Progressing   Problem: Nutrition: Goal: Adequate nutrition will be maintained Outcome: Progressing   Problem: Coping: Goal: Level of anxiety will decrease Outcome: Progressing   Problem: Elimination: Goal: Will not experience complications related to bowel motility Outcome: Progressing Goal: Will not experience complications related to urinary retention Outcome: Progressing   Problem: Pain  Managment: Goal: General experience of comfort will improve Outcome: Progressing   Problem: Safety: Goal: Ability to remain free from injury will improve Outcome: Progressing   Problem: Skin Integrity: Goal: Risk for impaired skin integrity will decrease Outcome: Progressing

## 2020-06-09 NOTE — Progress Notes (Signed)
Initial Nutrition Assessment  DOCUMENTATION CODES:   Non-severe (moderate) malnutrition in context of chronic illness  INTERVENTION:   Ensure Max protein supplement BID, each supplement provides 150kcal and 30g of protein.  Magic cup TID with meals, each supplement provides 290 kcal and 9 grams of protein  MVI po daily   Pt at high refeed risk; recommend monitor potassium, magnesium and phosphorus labs daily until stable  NUTRITION DIAGNOSIS:   Moderate Malnutrition related to chronic illness (CHF, advanced age) as evidenced by moderate fat depletion,moderate muscle depletion,severe muscle depletion.  GOAL:   Patient will meet greater than or equal to 90% of their needs  MONITOR:   PO intake,Supplement acceptance,Weight trends,Skin,I & O's,Labs  REASON FOR ASSESSMENT:   Malnutrition Screening Tool    ASSESSMENT:   86 y.o. male with medical history significant for CAD with history of MI status post PCI x9 with nonischemic myoview 03/2020, systolic heart failure last EF 30 to 35% 03/2020, HTN, history of GI bleed and CAD who is admitted with COVID 19 PNA   Met with pt in room today. Pt reports good appetite and oral intake pta and in hospital; pt is eating 100% of meals in hospital. Pt reports that he does not drink supplements at home. Per chart, pt is down 17lbs(11%) since February; this is significant weight loss. Pt is unaware of his weight loss and reports that he lost weight many years ago but reports that he is fine now. RD discussed with pt the importance of adequate nutrition needed to preserve lean muscle. Pt reports that he is willing to drink vanilla supplements if they are low in sugar. Pt reports that he hates milk and was reluctant to drink Ensure at first; RD explained the Ensure contains milk proteins but no actual milk or lactose, pt is willing to try this.   Medications reviewed and include: augmentin, vitamin C, aspirin, dexamethasone, lovenox, lasix, synthroid,  MVI, zinc  Labs reviewed: K 3.4(L), BUN 28(H)  NUTRITION - FOCUSED PHYSICAL EXAM:  Flowsheet Row Most Recent Value  Orbital Region Moderate depletion  Upper Arm Region Moderate depletion  Thoracic and Lumbar Region Moderate depletion  Buccal Region Moderate depletion  Temple Region Severe depletion  Clavicle Bone Region Severe depletion  Clavicle and Acromion Bone Region Severe depletion  Scapular Bone Region Moderate depletion  Dorsal Hand Severe depletion  Patellar Region Moderate depletion  Anterior Thigh Region Moderate depletion  Posterior Calf Region Moderate depletion  Edema (RD Assessment) None  Hair Reviewed  Eyes Reviewed  Mouth Reviewed  Skin Reviewed  Nails Reviewed     Diet Order:   Diet Order            Diet Heart Room service appropriate? Yes; Fluid consistency: Thin  Diet effective now                EDUCATION NEEDS:   Education needs have been addressed  Skin:  Skin Assessment: Reviewed RN Assessment (ecchymosis)  Last BM:  4/28  Height:   Ht Readings from Last 1 Encounters:  06/06/20 5' 7" (1.702 m)    Weight:   Wt Readings from Last 1 Encounters:  06/09/20 66.9 kg    Ideal Body Weight:  67.27 kg  BMI:  Body mass index is 23.1 kg/m.  Estimated Nutritional Needs:   Kcal:  1800-2100kcal/day  Protein:  90-105g/day  Fluid:  1.7-2.0L/day    MS, RD, LDN Please refer to [facility] for RD and/or RD on-call/weekend/after hours pager  

## 2020-06-09 NOTE — Progress Notes (Signed)
Physical Therapy Treatment Patient Details Name: Douglas Vance MRN: [id number] DOB: Jan 22, 1935 Today's Date: 06/09/2020    History of Present Illness Douglas Vance is an 86 y.o. male with a history of CAD, chronic HFrEF (LVEF 30-35%), GI bleeding, and HTN who presented to the ED by EMS with respiratory distress, hypoxia requiring 15L NRB that had worsened over the previous couple weeks. He was hypoxic with respiratory acidosis by VBG, BNP elevated to 1222, troponin 19 > 105, and covid-19 PCR was positive. Lactic acid 2.4 > 1.5. WBC 11.8. He vomited and there was concern for aspiration pneumonia for which antibiotics were given empirically for sepsis. CXR showed bilateral airspace disease and small effusions.    PT Comments    Pt seen for PT treatment with daughter present. Per nurse, pt on 2L/min upon PT arrival & placed on room air for session. Lowest SpO2 was 87% during mobility & pt able to quickly recover while continuing gait with pursed lip breathing after instructional cuing from PT. Pt demonstrates slowly improving balance as pt requires CGA<>min assist for ambulation without AD for increased gait distances. Pt would continue to benefit from ongoing acute PT services to address high level balance and trialing SPC for mobility.  Pt does report he lives on 3rd floor apartment but typically negotiates 3 flights of stairs but does have elevator access; encouraged pt to use elevator upon d/c.    Follow Up Recommendations  Home health PT;Supervision for mobility/OOB     Equipment Recommendations  Rolling walker with 5" wheels    Recommendations for Other Services       Precautions / Restrictions Precautions Precautions: Fall Restrictions Weight Bearing Restrictions: No    Mobility  Bed Mobility Overal bed mobility: Needs Assistance Bed Mobility: Supine to Sit     Supine to sit: Modified independent (Device/Increase time);HOB elevated          Transfers Overall  transfer level: Needs assistance Equipment used: None Transfers: Sit to/from Stand Sit to Stand: Min guard         General transfer comment: less posterior lean/LOB during transfer  Ambulation/Gait Ambulation/Gait assistance: Min assist;Min guard Gait Distance (Feet): 160 Feet Assistive device: None Gait Pattern/deviations: Decreased step length - right;Decreased step length - left;Decreased stride length Gait velocity: slightly decreased   General Gait Details: occasional LOB but easily able to correct, multiple laps in room to equate 160 ft   Stairs             Wheelchair Mobility    Modified Rankin (Stroke Patients Only)       Balance Overall balance assessment: Needs assistance Sitting-balance support: No upper extremity supported;Feet supported Sitting balance-Leahy Scale: Good     Standing balance support: No upper extremity supported Standing balance-Leahy Scale: Fair                              Cognition Arousal/Alertness: Awake/alert Behavior During Therapy: WFL for tasks assessed/performed Overall Cognitive Status: Within Functional Limits for tasks assessed                                 General Comments: Very pleasant man.      Exercises      General Comments        Pertinent Vitals/Pain Pain Assessment: No/denies pain    Home Living Family/patient expects to be discharged to:: Assisted living  Prior Function            PT Goals (current goals can now be found in the care plan section) Acute Rehab PT Goals Patient Stated Goal: return to PLOF PT Goal Formulation: With patient Time For Goal Achievement: 06/22/20 Potential to Achieve Goals: Good Progress towards PT goals: Progressing toward goals    Frequency    Min 2X/week      PT Plan Current plan remains appropriate    Co-evaluation              AM-PAC PT "6 Clicks" Mobility   Outcome Measure  Help  needed turning from your back to your side while in a flat bed without using bedrails?: None Help needed moving from lying on your back to sitting on the side of a flat bed without using bedrails?: None Help needed moving to and from a bed to a chair (including a wheelchair)?: A Little Help needed standing up from a chair using your arms (e.g., wheelchair or bedside chair)?: A Little Help needed to walk in hospital room?: A Little Help needed climbing 3-5 steps with a railing? : A Little 6 Click Score: 20    End of Session   Activity Tolerance: Patient tolerated treatment well Patient left: in chair;with call bell/phone within reach;with family/visitor present Nurse Communication:  (O2 levels) PT Visit Diagnosis: Unsteadiness on feet (R26.81);Muscle weakness (generalized) (M62.81)     Time: 1194-1740 PT Time Calculation (min) (ACUTE ONLY): 17 min  Charges:  $Therapeutic Activity: 8-22 mins                     [name], PT, DPT 06/09/20, 3:58 PM    [name] 06/09/2020, 3:54 PM

## 2020-06-09 NOTE — Progress Notes (Signed)
PROGRESS NOTE  Turrell Severt  OZH:[id number] DOB: October 06, 1934 DOA: 06/06/2020 PCP: Almetta Lovely, [facility]   Brief Narrative: Jaber Dunlow is an 86 y.o. male with a history of CAD, chronic HFrEF (LVEF 30-35%), GI bleeding, and HTN who presented to the ED by EMS with respiratory distress, hypoxia requiring 15L NRB that had worsened over the previous couple weeks. He was hypoxic with respiratory acidosis by VBG, BNP elevated to 1222, troponin 19 > 105, and covid-19 PCR was positive. Lactic acid 2.4 > 1.5. WBC 11.8. He vomited and there was concern for aspiration pneumonia for which antibiotics were given empirically for sepsis. CXR showed bilateral airspace disease and small effusions. He was placed on BiPAP after DNR and DNI status was confirmed and respiratory status has stabilized, still requiring HHFNC in ICU. CRP noted to be normal. Treatment with IV lasix, remdesivir, steroids, and empiric antibiotics have been given.   Assessment & Plan: Principal Problem:   Acute on chronic systolic CHF (congestive heart failure) (HCC) Active Problems:   CAD (coronary artery disease)   HTN (hypertension)   History of GI bleed   Pneumonia due to COVID-19 virus   Acute respiratory failure with hypoxia (HCC)   Lactic acidosis   Elevated troponin  Acute hypoxic and hypercarbic respiratory failure: Due to acute CHF, covid-19 pneumonia, and aspiration pneumonitis/pneumonia, with tobacco use history, suspect COPD at baseline though no wheezing to suggest exacerbation at this time. PCT negative on arrival. Based on celerity of improvement, lack of inflammatory marker elevation, strongly suspect CHF is primary driver of respiratory distress. No PE on CTA chest - Continue supplemental oxygen, hoping to wean further. He's been stable off BiPAP with improving clinical trajectory, will transfer to medical floor.  - Diuresis as below - Covid treatment to include remdesivir, steroids - Continue BDs by  inhaler. - Continue antibiotics given witnessed aspiration event in ED, though bacterial PNA is felt to be unlikely primary cause of presentation. Transitioned to augmentin with planned 5 days Tx.  Acute on chronic HFrEF:  - Continue lasix 40mg  IV BID. Continues to have good diuretic response. Weight down 162lbs >> 147lbs on 5/2.  - Monitor I/O, daily weights, BMP. Cr stable. - With dyssynchrony on echo, LBBB, ?if CRT would be indicated for this patient going forward. - With soft BPs, would suggest we hold entresto, imdur for now with no angina.  Hypokalemia: Despite preventive supplementation 5/1, will give 7/2 and monitor in AM.   Demand myocardial ischemia causing mild troponin elevation, CAD s/p PCI x9: No chest pain. BBB is preexisting. Nonischemic myoview Feb 2022. Degree of respiratory distress/hypoxia would account for troponin elevation at admission which has been trended to reassuring plateau.  - No interventions/work up scheduled per cardiology. - Continue BB, statin, ASA  Covid-19 pneumonia: CTA chest shows typical airspace opacities for covid pneumonia on my personal review which is congruent with radiologist interpretation. Unclear why CRP not higher.  - Started on remdesivir (plan 4/29 - 5/3) and standard dose decadron which we will continue. - CRP remains wnl not suggestive of cytokine storm. Maintain decadron dosing. - Remain on isolation x10 days per protocol.  - FV, IS   Nodular soft tissue pleural masses along the left lateral chest wall measuring 2.4 x 1.2 cm 0.7 x 1.8cm respectively: Scarring vs. malignancy.  - Plan contrast-enhanced CT chest after resolution of this acute illness.    DVT prophylaxis: Lovenox Code Status: DNR, DNI Family Communication: Daughter by phone daily Disposition Plan:  Status is:  Inpatient  Remains inpatient appropriate because:IV treatments appropriate due to intensity of illness or inability to take PO and Inpatient level of care  appropriate due to severity of illness  Dispo: The patient is from: Facility              Anticipated d/c is to: Anticipate return to facility.              Patient currently is not medically stable to d/c.   Difficult to place patient No  Consultants:   Cardiology  Procedures:   BiPAP  Antimicrobials:  Remdesivir 4/29 - 5/3  Vancomycin, cefepime 4/29  Unasyn 4/29 - 5/3   Subjective: Much less short of breath. Coughing up sputum and denies chest pain. Got up with PT, using IS persistently, getting up to 1500cc. Eager to progress clinically. States he's still urinating subjectively a lot. No nausea.  Objective: Vitals:   06/09/20 0400 06/09/20 0433 06/09/20 0600 06/09/20 0800  BP: (!) 110/57  109/81 138/63  Pulse: 69  60 67  Resp:    18  Temp: 98.5 F (36.9 C)   98.1 F (36.7 C)  TempSrc: Oral   Oral  SpO2: 99%  98% 96%  Weight:  66.9 kg    Height:        Intake/Output Summary (Last 24 hours) at 06/09/2020 1009 Last data filed at 06/09/2020 0900 Gross per 24 hour  Intake 703 ml  Output 1600 ml  Net -897 ml   Filed Weights   06/07/20 0500 06/08/20 0500 06/09/20 0433  Weight: 70.7 kg 70 kg 66.9 kg   Gen: Well-appearing elderly male in no distress Pulm: Nonlabored breathing HFNC with clearing crackles posteriorly, better aeration. CV: Regular rate and rhythm. No murmur, rub, or gallop. No JVD, no dependent edema. GI: Abdomen soft, non-tender, non-distended, with normoactive bowel sounds.  Ext: Warm, no deformities Skin: No new rashes, lesions or ulcers on visualized skin. Neuro: Alert and oriented. No focal neurological deficits. Psych: Judgement and insight appear fair. Mood euthymic & affect congruent. Behavior is appropriate.    Data Reviewed: I have personally reviewed following labs and imaging studies  CBC: Recent Labs  Lab 06/06/20 0214 06/08/20 0425  WBC 11.8* 9.4  NEUTROABS 6.6  --   HGB 15.2 13.7  HCT 46.8 40.7  MCV 93.4 91.5  PLT 240 182    Basic Metabolic Panel: Recent Labs  Lab 06/06/20 0214 06/07/20 0519 06/08/20 0425 06/09/20 0453  NA 139 137 141 138  K 3.6 4.1 3.6 3.4*  CL 106 100 101 100  CO2 26 27 31 29   GLUCOSE 132* 116* 110* 97  BUN 11 21 29* 28*  CREATININE 1.00 1.02 0.75 0.73  CALCIUM 8.9 9.0 8.7* 8.6*   GFR: Estimated Creatinine Clearance: 65.4 mL/min (by C-G formula based on SCr of 0.73 mg/dL). Liver Function Tests: Recent Labs  Lab 06/06/20 0214 06/08/20 0425 06/09/20 0453  AST 16 21 28   ALT 12 12 19   ALKPHOS 71 47 40  BILITOT 0.8 0.8 1.1  PROT 8.1 6.6 6.4*  ALBUMIN 4.1 3.4* 3.4*   No results for input(s): LIPASE, AMYLASE in the last 168 hours. No results for input(s): AMMONIA in the last 168 hours. Coagulation Profile: Recent Labs  Lab 06/06/20 0214  INR 1.0   Cardiac Enzymes: No results for input(s): CKTOTAL, CKMB, CKMBINDEX, TROPONINI in the last 168 hours. BNP (last 3 results) No results for input(s): PROBNP in the last 8760 hours. HbA1C: No results for input(s): HGBA1C  in the last 72 hours. CBG: Recent Labs  Lab 06/06/20 1809  GLUCAP 111*   Lipid Profile: Recent Labs    06/06/20 1845  CHOL 130  HDL 56  LDLCALC 63  TRIG 54  CHOLHDL 2.3   Thyroid Function Tests: No results for input(s): TSH, T4TOTAL, FREET4, T3FREE, THYROIDAB in the last 72 hours. Anemia Panel: No results for input(s): VITAMINB12, FOLATE, FERRITIN, TIBC, IRON, RETICCTPCT in the last 72 hours. Urine analysis:    Component Value Date/Time   COLORURINE YELLOW (A) 06/06/2020 0514   APPEARANCEUR CLEAR (A) 06/06/2020 0514   LABSPEC 1.008 06/06/2020 0514   PHURINE 5.0 06/06/2020 0514   GLUCOSEU NEGATIVE 06/06/2020 0514   HGBUR MODERATE (A) 06/06/2020 0514   BILIRUBINUR NEGATIVE 06/06/2020 0514   KETONESUR NEGATIVE 06/06/2020 0514   PROTEINUR NEGATIVE 06/06/2020 0514   NITRITE NEGATIVE 06/06/2020 0514   LEUKOCYTESUR NEGATIVE 06/06/2020 0514   Recent Results (from the past 240 hour(s))  Resp  Panel by RT-PCR (Flu A&B, Covid) Nasopharyngeal Swab     Status: Abnormal   Collection Time: 06/06/20  2:26 AM   Specimen: Nasopharyngeal Swab; Nasopharyngeal(NP) swabs in vial transport medium  Result Value Ref Range Status   SARS Coronavirus 2 by RT PCR POSITIVE (A) NEGATIVE Final    Comment: RESULT CALLED TO, READ BACK BY AND VERIFIED WITH: HALEY HAMILTON @0334  ON 06/06/20 SKL (NOTE) SARS-CoV-2 target nucleic acids are DETECTED.  The SARS-CoV-2 RNA is generally detectable in upper respiratory specimens during the acute phase of infection. Positive results are indicative of the presence of the identified virus, but do not rule out bacterial infection or co-infection with other pathogens not detected by the test. Clinical correlation with patient history and other diagnostic information is necessary to determine patient infection status. The expected result is Negative.  Fact Sheet for Patients: 06/08/20  Fact Sheet for Healthcare Providers: BloggerCourse.com  This test is not yet approved or cleared by the [employer] FDA and  has been authorized for detection and/or diagnosis of SARS-CoV-2 by FDA under an Emergency Use Authorization (EUA).  This EUA will remain in effect (meaning this test can  be used) for the duration of  the COVID-19 declaration under Section 564(b)(1) of the Act, 21 U.S.C. section 360bbb-3(b)(1), unless the authorization is terminated or revoked sooner.     Influenza A by PCR NEGATIVE NEGATIVE Final   Influenza B by PCR NEGATIVE NEGATIVE Final    Comment: (NOTE) The Xpert Xpress SARS-CoV-2/FLU/RSV plus assay is intended as an aid in the diagnosis of influenza from Nasopharyngeal swab specimens and should not be used as a sole basis for treatment. Nasal washings and aspirates are unacceptable for Xpert Xpress SARS-CoV-2/FLU/RSV testing.  Fact Sheet for  Patients: [place]  Fact Sheet for Healthcare Providers: BloggerCourse.com  This test is not yet approved or cleared by the [employer] FDA and has been authorized for detection and/or diagnosis of SARS-CoV-2 by FDA under an Emergency Use Authorization (EUA). This EUA will remain in effect (meaning this test can be used) for the duration of the COVID-19 declaration under Section 564(b)(1) of the Act, 21 U.S.C. section 360bbb-3(b)(1), unless the authorization is terminated or revoked.  Performed at [facility], [address] [place], [place]   Blood Culture (routine x 2)     Status: None (Preliminary result)   Collection Time: 06/06/20  2:26 AM   Specimen: BLOOD  Result Value Ref Range Status   Specimen Description BLOOD LEFT ANTECUBITAL  Final  Special Requests   Final    BOTTLES DRAWN AEROBIC AND ANAEROBIC Blood Culture adequate volume   Culture   Final    NO GROWTH 3 DAYS Performed at [facility], [address] [place], [place] [address]    Report Status PENDING  Incomplete  Blood Culture (routine x 2)     Status: None (Preliminary result)   Collection Time: 06/06/20  2:26 AM   Specimen: BLOOD  Result Value Ref Range Status   Specimen Description BLOOD BLOOD RIGHT HAND  Final   Special Requests   Final    BOTTLES DRAWN AEROBIC AND ANAEROBIC Blood Culture adequate volume   Culture   Final    NO GROWTH 3 DAYS Performed at [facility], [address] [place], [place] [address]    Report Status PENDING  Incomplete  Urine culture     Status: None   Collection Time: 06/06/20  5:14 AM   Specimen: In/Out Cath Urine  Result Value Ref Range Status   Specimen Description   Final    IN/OUT CATH URINE Performed at [facility], [address] [place], [place] [address]    Special Requests   Final    NONE Performed at [facility], [address] [place], [place] [address]    Culture   Final    NO GROWTH Performed at [facility], [address] [place], [place] [address]    Report Status 06/07/2020 FINAL  Final  MRSA PCR Screening     Status: None   Collection Time: 06/06/20  6:12 PM   Specimen: Nasal Mucosa; Nasopharyngeal  Result Value Ref Range Status   MRSA by PCR NEGATIVE NEGATIVE Final    Comment:        The GeneXpert MRSA Assay (FDA approved for NASAL specimens only), is one component of a comprehensive MRSA colonization surveillance program. It is not intended to diagnose MRSA infection nor to guide or monitor treatment for MRSA infections. Performed at [facility], [address] [place], [place] [address]       Radiology Studies: No results found.  Scheduled Meds: . albuterol  2 puff Inhalation Q6H  . amoxicillin-clavulanate  1 tablet Oral Q12H  . vitamin C  500 mg Oral Daily  . aspirin EC  81 mg Oral Daily  . atorvastatin  40 mg Oral Daily  . Chlorhexidine Gluconate Cloth  6 each Topical Daily  . dexamethasone (DECADRON) injection  6 mg Intravenous Q24H  . enoxaparin (LOVENOX) injection  40 mg Subcutaneous Q24H  . furosemide  40 mg Intravenous BID  . isosorbide mononitrate  30 mg Oral Daily  . sodium chloride flush  3 mL Intravenous Q12H  . zinc sulfate  220 mg Oral Daily   Continuous Infusions: . sodium chloride    . remdesivir 100 mg in NS 100 mL 100 mg (06/09/20 0915)     LOS: 3 days   Time spent: 35 minutes.  Tyrone Nineyan B Mohit Zirbes, MD [facility] [email or web address] 06/09/2020, 10:09 AM

## 2020-06-10 DIAGNOSIS — I25118 Atherosclerotic heart disease of native coronary artery with other forms of angina pectoris: Secondary | ICD-10-CM | POA: Diagnosis not present

## 2020-06-10 DIAGNOSIS — J9601 Acute respiratory failure with hypoxia: Secondary | ICD-10-CM | POA: Diagnosis not present

## 2020-06-10 DIAGNOSIS — I5023 Acute on chronic systolic (congestive) heart failure: Secondary | ICD-10-CM | POA: Diagnosis not present

## 2020-06-10 DIAGNOSIS — Z8719 Personal history of other diseases of the digestive system: Secondary | ICD-10-CM | POA: Diagnosis not present

## 2020-06-10 LAB — COMPREHENSIVE METABOLIC PANEL
ALT: 34 U/L (ref 0–44)
AST: 27 U/L (ref 15–41)
Albumin: 3.5 g/dL (ref 3.5–5.0)
Alkaline Phosphatase: 45 U/L (ref 38–126)
Anion gap: 9 (ref 5–15)
BUN: 32 mg/dL — ABNORMAL HIGH (ref 8–23)
CO2: 27 mmol/L (ref 22–32)
Calcium: 9.2 mg/dL (ref 8.9–10.3)
Chloride: 100 mmol/L (ref 98–111)
Creatinine, Ser: 0.69 mg/dL (ref 0.61–1.24)
GFR, Estimated: >60 mL/min (ref >60)
Glucose, Bld: 121 mg/dL — ABNORMAL HIGH (ref 70–99)
Potassium: 4 mmol/L (ref 3.5–5.1)
Sodium: 136 mmol/L (ref 135–145)
Total Bilirubin: 0.8 mg/dL (ref 0.3–1.2)
Total Protein: 6.7 g/dL (ref 6.5–8.1)

## 2020-06-10 LAB — MAGNESIUM: Magnesium: 1.8 mg/dL (ref 1.7–2.4)

## 2020-06-10 NOTE — Progress Notes (Signed)
PROGRESS NOTE  Douglas Vance  ZOX:[id number] DOB: 02/13/1934 DOA: 06/06/2020 PCP: Al[facility] LovelyHousecalls, [facility]   Brief Narrative: Douglas Vance is an 86 y.o. male with a history of CAD, chronic HFrEF (LVEF 30-35%), GI bleeding, and HTN who presented to the ED by EMS with respiratory distress, hypoxia requiring 15L NRB that had worsened over the previous couple weeks. He was hypoxic with respiratory acidosis by VBG, BNP elevated to 1222, troponin 19 > 105, and covid-19 PCR was positive. Lactic acid 2.4 > 1.5. WBC 11.8. He vomited and there was concern for aspiration pneumonia for which antibiotics were given empirically for sepsis. CXR showed bilateral airspace disease and small effusions. He was placed on BiPAP after DNR and DNI status was confirmed and respiratory status has stabilized, still requiring HHFNC in ICU. CRP noted to be normal. Treatment with IV lasix, remdesivir, steroids, and empiric antibiotics have been given.   Assessment & Plan: Principal Problem:   Acute on chronic systolic CHF (congestive heart failure) (HCC) Active Problems:   CAD (coronary artery disease)   HTN (hypertension)   History of GI bleed   Pneumonia due to COVID-19 virus   Acute respiratory failure with hypoxia (HCC)   Lactic acidosis   Elevated troponin   Malnutrition of moderate degree  Acute hypoxic and hypercarbic respiratory failure: Due to acute CHF, covid-19 pneumonia, and aspiration pneumonitis/pneumonia, with tobacco use history, suspect COPD at baseline though no wheezing to suggest exacerbation at this time. PCT negative on arrival. Based on celerity of improvement, lack of inflammatory marker elevation, strongly suspect CHF is primary driver of respiratory distress. No PE on CTA chest - Continue supplemental oxygen, dropped into 70%'s last night. Stable for medical floor bed. - Diuresis as below - Covid treatment to include remdesivir, steroids - Continue BDs by inhaler. - Continue antibiotics  given witnessed aspiration event in ED, though bacterial PNA is felt to be unlikely primary cause of presentation. Transitioned to augmentin with planned 5 days Tx.  Acute on chronic HFrEF:  - Continue lasix 40mg  IV BID. Continues to have good diuretic response. Weight down 162lbs >> 144lbs on 5/3. This is below previous EDW of 150lbs. Still with crackles on exam and hypoxia as above. Would benefit from continued IV diuresis and close monitoring to establish new dry weight.  - Monitor I/O, daily weights, BMP. Cr stable. - With dyssynchrony on echo, LBBB, ?if CRT would be indicated for this patient going forward. - Holing entresto, imdur for now with softer BP.  Hypokalemia:  - Resolved with supplementation. - Continue monitoring.   Demand myocardial ischemia causing mild troponin elevation, CAD s/p PCI x9: No chest pain. BBB is preexisting. Nonischemic myoview Feb 2022. Degree of respiratory distress/hypoxia would account for troponin elevation at admission which has been trended to reassuring plateau.  - No interventions/work up scheduled per cardiology. - Continue BB, statin, ASA  Covid-19 pneumonia: CTA chest shows typical airspace opacities for covid pneumonia on my personal review which is congruent with radiologist interpretation. Unclear why CRP not higher.  - Complete remdesivir today (4/29 - 5/3). - CRP remains wnl not suggestive of cytokine storm. Maintain decadron dosing. - Remain on isolation x10 days per protocol.  - FV, IS   Nodular soft tissue pleural masses along the left lateral chest wall measuring 2.4 x 1.2 cm 0.7 x 1.8cm respectively: Scarring vs. malignancy.  - Plan contrast-enhanced CT chest after resolution of this acute illness.    DVT prophylaxis: Lovenox Code Status: DNR, DNI Family Communication:  Daughter by phone daily Disposition Plan:  Status is: Inpatient  Remains inpatient appropriate because:Inpatient level of care appropriate due to severity of  illness, requiring ongoing diuresis for improving but refractory hypoxic respiratory failure.  Dispo: The patient is from: Facility              Anticipated d/c is to: Home with daughter              Patient currently is not medically stable to d/c.   Difficult to place patient No  Consultants:   Cardiology  Procedures:   BiPAP  Antimicrobials:  Remdesivir 4/29 - 5/3  Vancomycin, cefepime 4/29  Unasyn 4/29 - 5/3   Subjective: Getting up with PT/OT, still on 2L O2, but breathing much better than admission. Doesn't recall events around admission. No chest pain or leg swelling. Eating well and reporting subjectively brisk diuresis still.   Objective: Vitals:   06/10/20 0300 06/10/20 0400 06/10/20 0500 06/10/20 0800  BP:  (!) 126/52  107/71  Pulse: 65 68  68  Resp: 19 (!) 22    Temp:  98.1 F (36.7 C)  98 F (36.7 C)  TempSrc:  Oral  Oral  SpO2: 97% 98%  94%  Weight:   65.4 kg   Height:        Intake/Output Summary (Last 24 hours) at 06/10/2020 1237 Last data filed at 06/10/2020 1100 Gross per 24 hour  Intake 330 ml  Output 2500 ml  Net -2170 ml   Filed Weights   06/08/20 0500 06/09/20 0433 06/10/20 0500  Weight: 70 kg 66.9 kg 65.4 kg   Gen: 86 y.o. male in no distress Pulm: Nonlabored breathing 2L O2 with bibasilar crackles. CV: Regular rate and rhythm. No murmur, rub, or gallop. No JVD, no dependent edema. GI: Abdomen soft, non-tender, non-distended, with normoactive bowel sounds.  Ext: Warm, no deformities Skin: No new rashes, lesions or ulcers on visualized skin. Neuro: Alert and oriented. No focal neurological deficits. Psych: Judgement and insight appear fair. Mood euthymic & affect congruent. Behavior is appropriate.    Data Reviewed: I have personally reviewed following labs and imaging studies  CBC: Recent Labs  Lab 06/06/20 0214 06/08/20 0425  WBC 11.8* 9.4  NEUTROABS 6.6  --   HGB 15.2 13.7  HCT 46.8 40.7  MCV 93.4 91.5  PLT 240 182    Basic Metabolic Panel: Recent Labs  Lab 06/06/20 0214 06/07/20 0519 06/08/20 0425 06/09/20 0453 06/10/20 0459  NA 139 137 141 138 136  K 3.6 4.1 3.6 3.4* 4.0  CL 106 100 101 100 100  CO2 26 27 31 29 27   GLUCOSE 132* 116* 110* 97 121*  BUN 11 21 29* 28* 32*  CREATININE 1.00 1.02 0.75 0.73 0.69  CALCIUM 8.9 9.0 8.7* 8.6* 9.2  MG  --   --   --   --  1.8   GFR: Estimated Creatinine Clearance: 64.7 mL/min (by C-G formula based on SCr of 0.69 mg/dL). Liver Function Tests: Recent Labs  Lab 06/06/20 0214 06/08/20 0425 06/09/20 0453 06/10/20 0459  AST 16 21 28 27   ALT 12 12 19  34  ALKPHOS 71 47 40 45  BILITOT 0.8 0.8 1.1 0.8  PROT 8.1 6.6 6.4* 6.7  ALBUMIN 4.1 3.4* 3.4* 3.5   No results for input(s): LIPASE, AMYLASE in the last 168 hours. No results for input(s): AMMONIA in the last 168 hours. Coagulation Profile: Recent Labs  Lab 06/06/20 0214  INR 1.0   Cardiac  Enzymes: No results for input(s): CKTOTAL, CKMB, CKMBINDEX, TROPONINI in the last 168 hours. BNP (last 3 results) No results for input(s): PROBNP in the last 8760 hours. HbA1C: No results for input(s): HGBA1C in the last 72 hours. CBG: Recent Labs  Lab 06/06/20 1809  GLUCAP 111*   Lipid Profile: No results for input(s): CHOL, HDL, LDLCALC, TRIG, CHOLHDL, LDLDIRECT in the last 72 hours. Thyroid Function Tests: No results for input(s): TSH, T4TOTAL, FREET4, T3FREE, THYROIDAB in the last 72 hours. Anemia Panel: No results for input(s): VITAMINB12, FOLATE, FERRITIN, TIBC, IRON, RETICCTPCT in the last 72 hours. Urine analysis:    Component Value Date/Time   COLORURINE YELLOW (A) 06/06/2020 0514   APPEARANCEUR CLEAR (A) 06/06/2020 0514   LABSPEC 1.008 06/06/2020 0514   PHURINE 5.0 06/06/2020 0514   GLUCOSEU NEGATIVE 06/06/2020 0514   HGBUR MODERATE (A) 06/06/2020 0514   BILIRUBINUR NEGATIVE 06/06/2020 0514   KETONESUR NEGATIVE 06/06/2020 0514   PROTEINUR NEGATIVE 06/06/2020 0514   NITRITE NEGATIVE  06/06/2020 0514   LEUKOCYTESUR NEGATIVE 06/06/2020 0514   Recent Results (from the past 240 hour(s))  Resp Panel by RT-PCR (Flu A&B, Covid) Nasopharyngeal Swab     Status: Abnormal   Collection Time: 06/06/20  2:26 AM   Specimen: Nasopharyngeal Swab; Nasopharyngeal(NP) swabs in vial transport medium  Result Value Ref Range Status   SARS Coronavirus 2 by RT PCR POSITIVE (A) NEGATIVE Final    Comment: RESULT CALLED TO, READ BACK BY AND VERIFIED WITH: HALEY HAMILTON @0334  ON 06/06/20 SKL (NOTE) SARS-CoV-2 target nucleic acids are DETECTED.  The SARS-CoV-2 RNA is generally detectable in upper respiratory specimens during the acute phase of infection. Positive results are indicative of the presence of the identified virus, but do not rule out bacterial infection or co-infection with other pathogens not detected by the test. Clinical correlation with patient history and other diagnostic information is necessary to determine patient infection status. The expected result is Negative.  Fact Sheet for Patients: 06/08/20  Fact Sheet for Healthcare Providers: BloggerCourse.com  This test is not yet approved or cleared by the [employer]iousBroker.[place] FDA and  has been authorized for detection and/or diagnosis of SARS-CoV-2 by FDA under an Emergency Use Authorization (EUA).  This EUA will remain in effect (meaning this test can  be used) for the duration of  the COVID-19 declaration under Section 564(b)(1) of the Act, 21 U.S.C. section 360bbb-3(b)(1), unless the authorization is terminated or revoked sooner.     Influenza A by PCR NEGATIVE NEGATIVE Final   Influenza B by PCR NEGATIVE NEGATIVE Final    Comment: (NOTE) The Xpert Xpress SARS-CoV-2/FLU/RSV plus assay is intended as an aid in the diagnosis of influenza from Nasopharyngeal swab specimens and should not be used as a sole basis for treatment. Nasal washings and aspirates are  unacceptable for Xpert Xpress SARS-CoV-2/FLU/RSV testing.  Fact Sheet for Patients: [place]  Fact Sheet for Healthcare Providers: BloggerCourse.com  This test is not yet approved or cleared by the SeriousBroker.it FDA and has been authorized for detection and/or diagnosis of SARS-CoV-2 by FDA under an Emergency Use Authorization (EUA). This EUA will remain in effect (meaning this test can be used) for the duration of the COVID-19 declaration under Section 564(b)(1) of the Act, 21 U.S.C. section 360bbb-3(b)(1), unless the authorization is terminated or revoked.  Performed at [facility], [address] [place], [place]   Blood Culture (routine x 2)     Status: None (Preliminary result)   Collection  Time: 06/06/20  2:26 AM   Specimen: BLOOD  Result Value Ref Range Status   Specimen Description BLOOD LEFT ANTECUBITAL  Final   Special Requests   Final    BOTTLES DRAWN AEROBIC AND ANAEROBIC Blood Culture adequate volume   Culture   Final    NO GROWTH 4 DAYS Performed at [facility], [address], [place], [place] [address]    Report Status PENDING  Incomplete  Blood Culture (routine x 2)     Status: None (Preliminary result)   Collection Time: 06/06/20  2:26 AM   Specimen: BLOOD  Result Value Ref Range Status   Specimen Description BLOOD BLOOD RIGHT HAND  Final   Special Requests   Final    BOTTLES DRAWN AEROBIC AND ANAEROBIC Blood Culture adequate volume   Culture   Final    NO GROWTH 4 DAYS Performed at [facility], [address] [place], [place] [address]    Report Status PENDING  Incomplete  Urine culture     Status: None   Collection Time: 06/06/20  5:14 AM   Specimen: In/Out Cath Urine  Result Value Ref Range Status   Specimen Description   Final    IN/OUT CATH URINE Performed at [facility], [address] [place], [place] [address]    Special  Requests   Final    NONE Performed at [facility], [address], [place], [place] [address]    Culture   Final    NO GROWTH Performed at [facility], [address] [place], [place] [address]    Report Status 06/07/2020 FINAL  Final  MRSA PCR Screening     Status: None   Collection Time: 06/06/20  6:12 PM   Specimen: Nasal Mucosa; Nasopharyngeal  Result Value Ref Range Status   MRSA by PCR NEGATIVE NEGATIVE Final    Comment:        The GeneXpert MRSA Assay (FDA approved for NASAL specimens only), is one component of a comprehensive MRSA colonization surveillance program. It is not intended to diagnose MRSA infection nor to guide or monitor treatment for MRSA infections. Performed at [facility], [address] [place], [place] [address]       Radiology Studies: No results found.  Scheduled Meds: . albuterol  2 puff Inhalation Q6H  . amoxicillin-clavulanate  1 tablet Oral Q12H  . vitamin C  500 mg Oral Daily  . aspirin EC  81 mg Oral Daily  . atorvastatin  40 mg Oral Daily  . Chlorhexidine Gluconate Cloth  6 each Topical Daily  . dexamethasone (DECADRON) injection  6 mg Intravenous Q24H  . enoxaparin (LOVENOX) injection  40 mg Subcutaneous Q24H  . furosemide  40 mg Intravenous BID  . isosorbide mononitrate  30 mg Oral Daily  . levothyroxine  50 mcg Oral Q0600  . multivitamin with minerals  1 tablet Oral Daily  . pneumococcal 23 valent vaccine  0.5 mL Intramuscular Tomorrow-1000  . Ensure Max Protein  11 oz Oral BID  . sodium chloride flush  3 mL Intravenous Q12H  . traZODone  25 mg Oral QHS  . zinc sulfate  220 mg Oral Daily   Continuous Infusions: . sodium chloride       LOS: 4 days   Time spent: 35 minutes.  Tyrone Nine, MD [facility] [email or web address] 06/10/2020, 12:37 PM

## 2020-06-10 NOTE — Progress Notes (Signed)
Physical Therapy Treatment Patient Details Name: Douglas Vance MRN: [id number] DOB: 03-04-1934 Today's Date: 06/10/2020    History of Present Illness Douglas Vance is an 86 y.o. male with a history of CAD, chronic HFrEF (LVEF 30-35%), GI bleeding, and HTN who presented to the ED by EMS with respiratory distress, hypoxia requiring 15L NRB that had worsened over the previous couple weeks. He was hypoxic with respiratory acidosis by VBG, BNP elevated to 1222, troponin 19 > 105, and covid-19 PCR was positive. Lactic acid 2.4 > 1.5. WBC 11.8. He vomited and there was concern for aspiration pneumonia for which antibiotics were given empirically for sepsis. CXR showed bilateral airspace disease and small effusions.    PT Comments    Pt was long sitting in bed upon arriving with daughter at bedside. Pt agrees and is extremely motivated for OOB activity. Was easily able to exit R side of bed and ambulate. Does have dynamic balance deficits in which make him a high fall risk. Discussed with pt/pt's daughter need for use of AD until he regains all strength while improving balance through HHPT. All parties are in agreement. Per pt, planning to DC home tomorrow to daughters house. He tolerated entire session on rm air without desaturation. Recommend continued skilled PT to address dynamic balance deficits while improving independence with ADLs.    Follow Up Recommendations  Home health PT;Supervision for mobility/OOB     Equipment Recommendations  Rolling walker with 5" wheels       Precautions / Restrictions Precautions Precautions: Fall Restrictions Weight Bearing Restrictions: No    Mobility  Bed Mobility Overal bed mobility: Needs Assistance Bed Mobility: Supine to Sit     Supine to sit: Modified independent (Device/Increase time);HOB elevated     Transfers Overall transfer level: Needs assistance Equipment used: 1 person hand held assist;None Transfers: Sit to/from Stand Sit to  Stand: Min guard     Ambulation/Gait Ambulation/Gait assistance: Min guard;Min assist Gait Distance (Feet): 150 Feet Assistive device: 1 person hand held assist;None Gait Pattern/deviations: Step-through pattern (staggering posteriorly) Gait velocity: slightly decreased   Vance Gait Details: pt ambulated without AD however has posterior LOB with intervention to prevent fall. Daughter present and states she will make sure pt uses AD at DC. when pt ambulated with +1 UE support, tolerated well without LOB or safety concern.      Balance Overall balance assessment: Needs assistance Sitting-balance support: No upper extremity supported;Feet supported Sitting balance-Leahy Scale: Good     Standing balance support: No upper extremity supported;During functional activity Standing balance-Leahy Scale: Poor Standing balance comment: pt needed intervention during dynamic gait without UE support.       Cognition Arousal/Alertness: Awake/alert Behavior During Therapy: WFL for tasks assessed/performed Overall Cognitive Status: Within Functional Limits for tasks assessed      Vance Comments: Very pleasant man.             Pertinent Vitals/Pain Pain Assessment: No/denies pain           PT Goals (current goals can now be found in the care plan section) Acute Rehab PT Goals Patient Stated Goal: go home ( pt is going to daughters at DC) Progress towards PT goals: Progressing toward goals    Frequency    Min 2X/week      PT Plan Current plan remains appropriate       AM-PAC PT "6 Clicks" Mobility   Outcome Measure  Help needed turning from your back to your side while in a  flat bed without using bedrails?: None Help needed moving from lying on your back to sitting on the side of a flat bed without using bedrails?: None Help needed moving to and from a bed to a chair (including a wheelchair)?: A Little Help needed standing up from a chair using your arms (e.g.,  wheelchair or bedside chair)?: A Little Help needed to walk in hospital room?: A Little Help needed climbing 3-5 steps with a railing? : A Little 6 Click Score: 20    End of Session Equipment Utilized During Treatment: Oxygen Activity Tolerance: Patient tolerated treatment well Patient left: in chair;with call bell/phone within reach;with family/visitor present Nurse Communication: Mobility status PT Visit Diagnosis: Unsteadiness on feet (R26.81);Muscle weakness (generalized) (M62.81)     Time: 8527-7824 PT Time Calculation (min) (ACUTE ONLY): 24 min  Charges:  $Gait Training: 8-22 mins $Therapeutic Activity: 8-22 mins                     Jetta Lout PTA 06/10/20, 4:15 PM

## 2020-06-11 DIAGNOSIS — J1282 Pneumonia due to coronavirus disease 2019: Secondary | ICD-10-CM | POA: Diagnosis not present

## 2020-06-11 DIAGNOSIS — U071 COVID-19: Secondary | ICD-10-CM | POA: Diagnosis not present

## 2020-06-11 DIAGNOSIS — J690 Pneumonitis due to inhalation of food and vomit: Secondary | ICD-10-CM

## 2020-06-11 DIAGNOSIS — I5023 Acute on chronic systolic (congestive) heart failure: Secondary | ICD-10-CM | POA: Diagnosis not present

## 2020-06-11 LAB — CULTURE, BLOOD (ROUTINE X 2)
Culture: NO GROWTH
Culture: NO GROWTH
Special Requests: ADEQUATE
Special Requests: ADEQUATE

## 2020-06-11 LAB — COMPREHENSIVE METABOLIC PANEL
ALT: 35 U/L (ref 0–44)
AST: 28 U/L (ref 15–41)
Albumin: 3.8 g/dL (ref 3.5–5.0)
Alkaline Phosphatase: 50 U/L (ref 38–126)
Anion gap: 9 (ref 5–15)
BUN: 35 mg/dL — ABNORMAL HIGH (ref 8–23)
CO2: 30 mmol/L (ref 22–32)
Calcium: 9.5 mg/dL (ref 8.9–10.3)
Chloride: 97 mmol/L — ABNORMAL LOW (ref 98–111)
Creatinine, Ser: 0.88 mg/dL (ref 0.61–1.24)
GFR, Estimated: >60 mL/min (ref >60)
Glucose, Bld: 115 mg/dL — ABNORMAL HIGH (ref 70–99)
Potassium: 3.9 mmol/L (ref 3.5–5.1)
Sodium: 136 mmol/L (ref 135–145)
Total Bilirubin: 0.8 mg/dL (ref 0.3–1.2)
Total Protein: 6.9 g/dL (ref 6.5–8.1)

## 2020-06-11 MED ORDER — DEXAMETHASONE 6 MG PO TABS: 6.0000 mg | ORAL_TABLET | Freq: Two times a day (BID) | ORAL | 0 refills | Status: AC

## 2020-06-11 MED ORDER — AMOXICILLIN-POT CLAVULANATE 875-125 MG PO TABS: 1.0000 | ORAL_TABLET | Freq: Two times a day (BID) | ORAL | 0 refills | Status: AC

## 2020-06-11 MED ORDER — TORSEMIDE 20 MG PO TABS: 40.0000 mg | ORAL_TABLET | Freq: Two times a day (BID) | ORAL | 0 refills | Status: DC

## 2020-06-11 NOTE — TOC Transition Note (Addendum)
Transition of Care [facility]) - CM/SW Discharge Note   Patient Details  Name: Douglas Vance MRN: [id number] Date of Birth: [birth year]  Transition of Care [facility]) CM/SW Contact:  Marina Goodell Phone Number: [phone] 06/11/2020, 11:58 AM   Clinical Narrative:     Patient will d/c home with Tressie Stalker (Daughter) [phone] w/ home health for PT and RN.  [facility] has confirmed they will provide home health services.  Ms. Dimas Aguas confirmed patient has walker at home. Attending and ICU RN updated, and requested home health orders for PT and RN.  Attending confirmed request ans will speak with Ms. Dimas Aguas who is at bedside.   Final next level of care: Home w Home Health Services Barriers to Discharge: Continued Medical Work up   Patient Goals and CMS Choice Patient states their goals for this hospitalization and ongoing recovery are:: To regain strength and return to ALF. CMS Medicare.gov Compare Post Acute Care list provided to:: Other (Comment Required) Choice offered to / list presented to : Adult Children Tressie Stalker (Daughter)   [phone])  Discharge Placement                       Discharge Plan and Services In-house Referral: Clinical Social Work   Post Acute Care Choice: Home Health                    HH Arranged: PT,RN [facility] Agency: [facility] Date HH Agency Contacted: 06/09/20 Time HH Agency Contacted: 1155 Representative spoke with at [facility] [place] Agency: Barbara Cower  Social Determinants of Health (SDOH) Interventions     Readmission Risk Interventions No flowsheet data found.

## 2020-06-11 NOTE — Discharge Summary (Addendum)
Physician Discharge Summary  Patient ID: Stein Windhorst MRN: [id number] DOB/AGE: [id number] y.o.  Admit date: 06/06/2020 Discharge date: 06/11/2020  Admission Diagnoses:  Discharge Diagnoses:  Principal Problem:   Acute on chronic systolic CHF (congestive heart failure) (HCC) Active Problems:   CAD (coronary artery disease)   HTN (hypertension)   History of GI bleed   Pneumonia due to COVID-19 virus   Acute respiratory failure with hypoxia (HCC)   Lactic acidosis   Elevated troponin   Malnutrition of moderate degree 06/13/2020. Respond to Query: Sepsis ruled out.   Discharged Condition: good  Hospital Course:  Lucillie Garfinkel [phone] y.o.malewith a history of CAD, chronic HFrEF (LVEF 30-35%), GI bleeding, and HTN who presented to the ED by EMS with respiratory distress, hypoxia requiring 15L NRB that had worsened over the previous couple weeks. He was hypoxic with respiratory acidosis by VBG, BNP elevated to 1222, troponin 19 > 105, and covid-19 PCR was positive. Lactic acid 2.4 > 1.5. WBC 11.8. He vomited and there was concern for aspiration pneumonia for which antibiotics were given empirically for sepsis. CXR showed bilateral airspace disease and small effusions. He was placed on BiPAP after DNR and DNI status was confirmed and respiratory status has stabilized, still requiring HHFNC in ICU. CRP noted to be normal.Treatment with IV lasix, remdesivir, steroids, and empiric antibiotics have been given.   #1.  Acute respiratory failure with hypoxemia and hypercapnia. Acute on chronic systolic congestive heart failure. Covid 19 pneumonia. Aspiration pneumonia. COPD without exacerbation. Patient was given remdesivir, steroids, he was also covered with antibiotics.  He also required BiPAP in ICU.  He was off oxygen since yesterday, condition had improved. I will continue torsemide, patient be follow-up with his PCP and cardiology as outpatient to adjust medications for his  congestive heart failure treatment. I will continue 4 more days of oral Augmentin for aspiration pneumonia. Also continue finish of 10 days of oral steroids for COVID-19 pneumonia.  #2.  Demand ischemia.  Secondary to COVID-19 pneumonia and congestive heart failure. Stable.  3.  Nodular soft tissue pleural mass alongside left lateral chest wall. Patient be followed by oncology as outpatient.    Consults: None  Significant Diagnostic Studies:   CT ANGIOGRAPHY CHEST WITH CONTRAST  TECHNIQUE: Multidetector CT imaging of the chest was performed using the standard protocol during bolus administration of intravenous contrast. Multiplanar CT image reconstructions and MIPs were obtained to evaluate the vascular anatomy.  CONTRAST:  OMNIPAQUE IOHEXOL 350 MG/ML SOLN  COMPARISON:  None.  FINDINGS: Cardiovascular: Satisfactory opacification of the pulmonary arteries to the segmental level. No evidence of pulmonary embolism. Mild cardiomegaly. No pericardial effusion. Coronary artery atherosclerosis. Thoracic aortic atherosclerosis.  Mediastinum/Nodes: No enlarged mediastinal, hilar, or axillary lymph nodes. Thyroid gland, trachea, and esophagus demonstrate no significant findings.  Lungs/Pleura: Small bilateral pleural effusions. Bilateral calcified pleural plaques as can be seen with prior asbestos exposure or infection. Nodular soft tissue pleural masses along the left lateral chest wall measuring 2.4 x 1.2 cm 0.7 x 1.8 cm respectively. Bilateral lower lobe, right middle lobe and bilateral upper lobe interstitial and alveolar airspace disease most concerning for multilobar pneumonia including atypical viral pneumonia such as COVID 19. No pneumothorax.  Upper Abdomen: No acute abnormality.  Musculoskeletal: No acute osseous abnormality. No aggressive osseous lesion. Age-indeterminate T4 vertebral body compression fracture.  Review of the MIP images confirms the  above findings.  IMPRESSION: 1. No evidence of pulmonary embolus. 2. Bilateral lower lobe, right middle lobe and  bilateral upper lobe interstitial and alveolar airspace disease most concerning for multilobar pneumonia including atypical viral pneumonia such as COVID 19. 3. Small bilateral pleural effusions. 4. Bilateral calcified pleural plaques as can be seen with prior asbestos exposure or infection. Nodular soft tissue pleural masses along the left lateral chest wall measuring 2.4 x 1.2 cm 0.7 x 1.8 cm respectively which may reflect focal scarring versus malignancy. Recommend follow-up CT of the chest with intravenous contrast following the patient's acute illness. 5. Age-indeterminate T4 vertebral body compression fracture. 6. Aortic Atherosclerosis (ICD10-I70.0).   Electronically Signed   By: Elige Ko   On: 06/06/2020 18:13   Treatments: Antibiotics, steroids, IV Lasix  Discharge Exam: Blood pressure 140/85, pulse (!) 101, temperature 98.1 F (36.7 C), temperature source Axillary, resp. rate (!) 26, height 5\' 7"  (1.702 m), weight 72.1 kg, SpO2 94 %. General appearance: alert and cooperative Resp: clear to auscultation bilaterally Cardio: regular rate and rhythm, S1, S2 normal, no murmur, click, rub or gallop GI: soft, non-tender; bowel sounds normal; no masses,  no organomegaly Extremities: extremities normal, atraumatic, no cyanosis or edema  Disposition: Discharge disposition: 01-Home or Self Care       Discharge Instructions    Diet - low sodium heart healthy   Complete by: As directed    Fluid restriction <1573ml/day   Increase activity slowly   Complete by: As directed      Allergies as of 06/11/2020      Reactions   Cipro [ciprofloxacin Hcl]       Medication List    TAKE these medications   albuterol 108 (90 Base) MCG/ACT inhaler Commonly known as: VENTOLIN HFA Inhale 1 puff into the lungs every 4 (four) hours.   aspirin EC 81 MG  tablet Take 81 mg by mouth daily. Swallow whole.   dexamethasone 6 MG tablet Commonly known as: DECADRON Take 1 tablet (6 mg total) by mouth 2 (two) times daily with a meal for 5 days.   donepezil 5 MG tablet Commonly known as: ARICEPT Take 5 mg by mouth at bedtime.   isosorbide mononitrate 30 MG 24 hr tablet Commonly known as: IMDUR Take 1 tablet (30 mg total) by mouth daily.   levothyroxine 50 MCG tablet Commonly known as: SYNTHROID Take 50 mcg by mouth every morning.   lovastatin 20 MG tablet Commonly known as: MEVACOR Take 20 mg by mouth daily with supper.   metoprolol succinate 25 MG 24 hr tablet Commonly known as: TOPROL-XL Take 0.5 tablets (12.5 mg total) by mouth daily.   omeprazole 20 MG capsule Commonly known as: PRILOSEC Take 20 mg by mouth daily.   sertraline 50 MG tablet Commonly known as: ZOLOFT Take 50 mg by mouth daily.   tamsulosin 0.4 MG Caps capsule Commonly known as: FLOMAX Take 0.4 mg by mouth daily.   torsemide 20 MG tablet Commonly known as: Demadex Take 2 tablets (40 mg total) by mouth 2 (two) times daily.   traZODone 50 MG tablet Commonly known as: DESYREL Take 25-50 mg by mouth at bedtime.   VITAMIN D PO Take 2,000 Int'l Units/day by mouth daily.       Follow-up Information    [facility] Follow up in 1 week(s).   Specialty: Geriatric Medicine Contact information: [address] [place] [id number]              36 minutes Signed: [phone] 06/11/2020, 12:38 PM

## 2020-06-19 ENCOUNTER — Other Ambulatory Visit: Payer: Self-pay

## 2020-06-19 ENCOUNTER — Ambulatory Visit (HOSPITAL_BASED_OUTPATIENT_CLINIC_OR_DEPARTMENT_OTHER): Payer: Medicare Other | Admitting: Family

## 2020-06-19 ENCOUNTER — Telehealth: Payer: Self-pay | Admitting: Family

## 2020-06-19 ENCOUNTER — Other Ambulatory Visit
Admission: RE | Admit: 2020-06-19 | Discharge: 2020-06-19 | Disposition: A | Discharge status: Home / Self Care | Payer: Medicare Other | Source: Ambulatory Visit | Attending: Family | Admitting: Family

## 2020-06-19 ENCOUNTER — Encounter: Payer: Self-pay | Admitting: Family

## 2020-06-19 VITALS — BP 137/61 | HR 67 | Resp 16 | Ht 67.0 in | Wt 154.5 lb

## 2020-06-19 DIAGNOSIS — J439 Emphysema, unspecified: Secondary | ICD-10-CM | POA: Insufficient documentation

## 2020-06-19 DIAGNOSIS — Z87891 Personal history of nicotine dependence: Secondary | ICD-10-CM | POA: Insufficient documentation

## 2020-06-19 DIAGNOSIS — I5020 Unspecified systolic (congestive) heart failure: Secondary | ICD-10-CM | POA: Insufficient documentation

## 2020-06-19 DIAGNOSIS — Z79899 Other long term (current) drug therapy: Secondary | ICD-10-CM | POA: Insufficient documentation

## 2020-06-19 DIAGNOSIS — I252 Old myocardial infarction: Secondary | ICD-10-CM | POA: Insufficient documentation

## 2020-06-19 DIAGNOSIS — Z7982 Long term (current) use of aspirin: Secondary | ICD-10-CM | POA: Insufficient documentation

## 2020-06-19 DIAGNOSIS — I110 Hypertensive heart disease with heart failure: Secondary | ICD-10-CM | POA: Insufficient documentation

## 2020-06-19 DIAGNOSIS — I2510 Atherosclerotic heart disease of native coronary artery without angina pectoris: Secondary | ICD-10-CM | POA: Insufficient documentation

## 2020-06-19 DIAGNOSIS — Z8616 Personal history of COVID-19: Secondary | ICD-10-CM | POA: Insufficient documentation

## 2020-06-19 DIAGNOSIS — I10 Essential (primary) hypertension: Secondary | ICD-10-CM

## 2020-06-19 LAB — BASIC METABOLIC PANEL
Anion gap: 9 (ref 5–15)
BUN: 31 mg/dL — ABNORMAL HIGH (ref 8–23)
CO2: 33 mmol/L — ABNORMAL HIGH (ref 22–32)
Calcium: 9 mg/dL (ref 8.9–10.3)
Chloride: 92 mmol/L — ABNORMAL LOW (ref 98–111)
Creatinine, Ser: 0.93 mg/dL (ref 0.61–1.24)
GFR, Estimated: >60 mL/min (ref >60)
Glucose, Bld: 122 mg/dL — ABNORMAL HIGH (ref 70–99)
Potassium: 3.2 mmol/L — ABNORMAL LOW (ref 3.5–5.1)
Sodium: 134 mmol/L — ABNORMAL LOW (ref 135–145)

## 2020-06-19 MED ORDER — POTASSIUM CHLORIDE CRYS ER 20 MEQ PO TBCR
20.0000 meq | EXTENDED_RELEASE_TABLET | Freq: Every day | ORAL | 0 refills | Status: DC
Start: 2020-06-19 — End: 2020-06-30

## 2020-06-19 NOTE — Telephone Encounter (Signed)
LM on daughter's voicemail regarding BMP results obtained earlier today. Advised that renal function and sodium look great but that his potassium is now 3.2. He's been taking his diuretic daily instead of BID.   Will send in 2 tablets of potassium so that he can take potassium daily for 2 days. He sees cardiology on 06/23/20 and labs can be repeated at that time.

## 2020-06-19 NOTE — Progress Notes (Signed)
Patient ID: Douglas Vance, male    DOB: 1934/12/27, 86 y.o.   MRN: [id number]  HPI  Douglas Vance is a 86 y/o male with a history of CAD, HTN, BPH, previous tobacco use and chronic heart failure.   Echo report from 03/13/20 reviewed and showed an EF of 30-35% along with moderate LAE and mild Douglas.   Admitted 06/06/20 due to acute on chronic HF along with Covid pneumonia. Cardiology consult obtained. Initially hypoxic requiring 15L. Also required bipap and treatment with IV lasix, remdesivir, steroids, and empiric antibiotics. Able to be weaned off oxygen. Troponin elevation thought to be due to demand ischemia. Discharged after 5 days.   He presents today for his initial visit with a chief complaint of minimal shortness of breath with moderate exertion. He describes this as having been present for several months. He has associated fatigue, cough, light-headedness and difficulty sleeping along with this. He denies any abdominal distention, palpitations, pedal edema or chest pain.   Has scales at his place at [facility] but has recently been staying with his daughter so hasn't been weighing himself. Has backed off on his diuretic from BID to daily because his home nurse felt he was overdiuresed because he was not feeling well and was feeling dizzy. Has been drinking some pedialyte/ gatorade and dizziness has since improved.   Past Medical History:  Diagnosis Date  . BPH (benign prostatic hyperplasia)   . CHF (congestive heart failure) (HCC)   . Coronary artery disease   . Hypertension   . Myocardial infarction [facility])    Past Surgical History:  Procedure Laterality Date  . CARDIAC CATHETERIZATION    . VASECTOMY     History reviewed. No pertinent family history. Social History   Tobacco Use  . Smoking status: Former Games developer  . Smokeless tobacco: Never Used  Substance Use Topics  . Alcohol use: Not Currently   Allergies  Allergen Reactions  . Cipro [Ciprofloxacin Hcl]    Prior to  Admission medications   Medication Sig Start Date End Date Taking? Authorizing Provider  albuterol (VENTOLIN HFA) 108 (90 Base) MCG/ACT inhaler Inhale 1 puff into the lungs every 4 (four) hours. 05/22/20  Yes [provider]  donepezil (ARICEPT) 5 MG tablet Take 5 mg by mouth at bedtime.   Yes [provider]  isosorbide mononitrate (IMDUR) 30 MG 24 hr tablet Take 1 tablet (30 mg total) by mouth daily. 03/18/20 06/16/20 Yes Agbor-Etang, Arlys John, MD  levothyroxine (SYNTHROID) 50 MCG tablet Take 50 mcg by mouth every morning. 04/14/20  Yes [provider]  lovastatin (MEVACOR) 20 MG tablet Take 20 mg by mouth daily with supper.   Yes [provider]  metoprolol succinate (TOPROL-XL) 25 MG 24 hr tablet Take 0.5 tablets (12.5 mg total) by mouth daily. Patient taking differently: Take 25 mg by mouth daily. 03/27/20  Yes Agbor-Etang, Arlys John, MD  omeprazole (PRILOSEC) 20 MG capsule Take 20 mg by mouth daily.   Yes [provider]  sertraline (ZOLOFT) 50 MG tablet Take 50 mg by mouth daily. 04/14/20  Yes [provider]  tamsulosin (FLOMAX) 0.4 MG CAPS capsule Take 0.4 mg by mouth daily.   Yes [provider]  torsemide (DEMADEX) 20 MG tablet Take 2 tablets (40 mg total) by mouth 2 (two) times daily. Patient taking differently: Take 20 mg by mouth daily. 06/11/20  Yes Marrion Coy, MD  traZODone (DESYREL) 50 MG tablet Take 50 mg by mouth at bedtime. 04/14/20  Yes [provider]  aspirin EC 81 MG tablet Take 81 mg by mouth daily. Swallow whole. Patient not taking: Reported on 06/19/2020    [provider]    Review of Systems  Constitutional: Positive for fatigue. Negative for appetite change.  HENT: Negative for congestion, postnasal drip and sore throat.   Eyes: Negative.   Respiratory: Positive for cough and shortness of breath (minimal).   Cardiovascular: Negative for chest pain, palpitations and leg swelling.  Gastrointestinal:  Negative for abdominal distention and abdominal pain.  Endocrine: Negative.   Genitourinary: Negative.   Musculoskeletal: Negative for back pain and neck pain.  Skin: Negative.   Allergic/Immunologic: Negative.   Neurological: Positive for light-headedness. Negative for dizziness.  Hematological: Does not bruise/bleed easily.  Psychiatric/Behavioral: Positive for sleep disturbance (not sleeping well). Negative for dysphoric mood. The patient is not nervous/anxious.    Vitals:   06/19/20 1001  BP: 137/61  Pulse: 67  Resp: 16  SpO2: 99%  Weight: 154 lb 8 oz (70.1 kg)  Height: 5\' 7"  (1.702 m)   Wt Readings from Last 3 Encounters:  06/19/20 154 lb 8 oz (70.1 kg)  06/11/20 158 lb 15.2 oz (72.1 kg)  05/12/20 165 lb (74.8 kg)   Lab Results  Component Value Date   CREATININE 0.88 06/11/2020   CREATININE 0.69 06/10/2020   CREATININE 0.73 06/09/2020   Physical Exam Vitals and nursing note reviewed. Exam conducted with a chaperone present (daughter).  Constitutional:      Appearance: He is well-developed.  HENT:     Head: Normocephalic and atraumatic.  Neck:     Vascular: No JVD.  Cardiovascular:     Rate and Rhythm: Normal rate and regular rhythm.  Pulmonary:     Effort: Pulmonary effort is normal. No respiratory distress.     Breath sounds: No wheezing or rales.  Abdominal:     Palpations: Abdomen is soft.     Tenderness: There is no abdominal tenderness.  Musculoskeletal:     Cervical back: Neck supple.     Right lower leg: No tenderness. No edema.     Left lower leg: No tenderness. No edema.  Skin:    General: Skin is warm and dry.  Neurological:     General: No focal deficit present.     Mental Status: He is alert and oriented to person, place, and time.  Psychiatric:        Mood and Affect: Mood normal.        Behavior: Behavior normal.    Assessment & Plan:  1: Chronic heart failure with reduced ejection fraction- - NYHA class II - euvolemic today - not  weighing because he's been at his daughter's but is planning on going back to [facility] and can weigh daily there; advised to call for an overnight weight gain of > 2 pounds or a weekly weight gain of > 5 pounds - previously was adding salt but hasn't been adding salt lately and daughter has removed the salt shaker; low sodium cookbook and written dietary information was given to him - saw cardiology (Agbor-Etang) 05/12/20; returns 06/23/20 - has been on lisinopril in the past and thinks it was stopped due to hypotension; could consider trying entresto at future visits - currently on GDMT of metoprolol although HR doesn't allow for titration today - did discuss possibly adding spirononalactone/ SGLT2 at future visits as well - BNP 06/06/20 was 1222.8 - PharmD reconciled medications with the patient and his daughter  2: HTN- -  BP looks good today - PCP is [facility] - BMP 06/11/20 reviewed and showed sodium 136, potassium 3.9, creatinine 0.88 and GFR >60 - repeat BMP today  3: Emphysema- - saw pulmonology Westley Hummer) 07/25/19 - has albuterol inhaler that he uses PRN   Medication bottles reviewed.   Return in 1 month or sooner for any questions/problems before then.

## 2020-06-19 NOTE — Progress Notes (Signed)
[facility] - HEART FAILURE CLINIC - PHARMACIST COUNSELING NOTE  ADHERENCE ASSESSMENT   Do you ever forget to take your medication? [] Yes (1) [x] No (0)  Do you ever skip doses due to side effects? [] Yes (1) [x] No (0)  Do you have trouble affording your medicines? [] Yes (1) [x] No (0)  Are you ever unable to pick up your medication due to transportation difficulties? [] Yes (1) [x] No (0)  Do you ever stop taking your medications because you don't believe they are helping? [] Yes (1) [x] No (0)  Total score _0______    Recommendations given to patient about increasing adherence: Pt is adherent with current regimen. Pt uses pill packs to help manage medications.  Guideline-Directed Medical Therapy/Evidence Based Medicine  ACE/ARB/ARNI: N/A Beta Blocker: metoprolol succinate  Aldosterone Antagonist: N/A Diuretic: torsemide    SUBJECTIVE  HPI:  Past Medical History:  Diagnosis Date  . BPH (benign prostatic hyperplasia)   . Coronary artery disease   . Hypertension   . Myocardial infarction [facility])         OBJECTIVE   Vital signs: HR 67, BP 137-61, weight (pounds) 154 ECHO: Date 03/13/20, EF 30-35%  BMP Latest Ref Rng & Units 06/11/2020 06/10/2020 06/09/2020  Glucose 70 - 99 mg/dL ) ) 97  BUN 8 - 23 mg/dL ) ) )  Creatinine 0.61 - 1.24 mg/dL [facility] 05/11/20 08/11/2020  Sodium 135 - 145 mmol/L 136 136 138  Potassium 3.5 - 5.1 mmol/L 3.9 4.0 3.4(L)  Chloride 98 - 111 mmol/L 97(L) 100 100  CO2 22 - 32 mmol/L 30 27 29   Calcium 8.9 - 10.3 mg/dL 9.5 9.2 08/10/2020)    ASSESSMENT 86 yo M presenting to heart failure clinic for new patient visit. Pt was hospitalized  06/06/20 to 06/11/20 for acute on chronic CHF. PMH includes MI, HTN, CAD, BPH, and CHF. Home nurse told pt to cut down on torsemide dose to 20 mg daily from 40 mg twice daily due to overdiuresis and to begin eating bananas and drinking Pedialyte. No barriers to adherence identified during medication  reconciliation. Pt uses pill packs to help remember doses.    PLAN CHF/HTN - Continue metoprolol succinate 25 mg daily - Continue torsemide 20 mg daily - Recheck labs due to concern for overdiuresis - Consider addition of ACE/ARB/Entresto after checking labs  BPH - Continue tamsulosin 0.4 mg daily   CAD/hx of MI - Consider adding aspirin 81 mg daily  - Consider increasing lovastatin 20 mg to atorvastatin 40 mg daily for high-intensity statin therapy  - Continue metoprolol succinate 25 mg daily - Consider addition of ACE/ARB/Entresto after checking labs  Memory - Continue donepezil 5 mg daily   Sleep - Continue trazodone 50 mg daily  Mood - Continue sertraline 50 mg daily   Acid reflux - Continue omeprazole 20 mg daily   Hypothyroidism - Continue levothyroxine 50 mcg daily  Angina - Continue isosorbide mononitrate 30 mg daily   Shortness of breath - Continue albuterol use as needed   Time spent: 15 minutes  10(G, PharmD Pharmacy Resident  84/01/2021 10:33 AM    Current Outpatient Medications:  .  albuterol (VENTOLIN HFA) 108 (90 Base) MCG/ACT inhaler, Inhale 1 puff into the lungs every 4 (four) hours., Disp: , Rfl:  .  aspirin EC 81 MG tablet, Take 81 mg by mouth daily. Swallow whole., Disp: , Rfl:  .  donepezil (ARICEPT) 5 MG tablet, Take 5 mg by mouth at bedtime., Disp: , Rfl:  .  isosorbide mononitrate (IMDUR) 30 MG 24 hr tablet, Take 1 tablet (30 mg total) by mouth daily., Disp: 30 tablet, Rfl: 5 .  levothyroxine (SYNTHROID) 50 MCG tablet, Take 50 mcg by mouth every morning., Disp: , Rfl:  .  lovastatin (MEVACOR) 20 MG tablet, Take 20 mg by mouth daily with supper., Disp: , Rfl:  .  metoprolol succinate (TOPROL-XL) 25 MG 24 hr tablet, Take 0.5 tablets (12.5 mg total) by mouth daily., Disp: , Rfl:  .  omeprazole (PRILOSEC) 20 MG capsule, Take 20 mg by mouth daily., Disp: , Rfl:  .  sertraline (ZOLOFT) 50 MG tablet, Take 50 mg by mouth daily.,  Disp: , Rfl:  .  tamsulosin (FLOMAX) 0.4 MG CAPS capsule, Take 0.4 mg by mouth daily., Disp: , Rfl:  .  torsemide (DEMADEX) 20 MG tablet, Take 2 tablets (40 mg total) by mouth 2 (two) times daily., Disp: 60 tablet, Rfl: 0 .  traZODone (DESYREL) 50 MG tablet, Take 25-50 mg by mouth at bedtime., Disp: , Rfl:  .  VITAMIN D PO, Take 2,000 Int'l Units/day by mouth daily., Disp: , Rfl:    COUNSELING POINTS/CLINICAL PEARLS Metoprolol Succinate (Goal: 200 mg once daily) Warn patient to avoid activities requiring mental alertness or coordination until drug effects are realized, as drug may cause dizziness. Tell patient planning major surgery with anesthesia to alert physician that drug is being used, as drug impairs ability of heart to respond to reflex adrenergic stimuli. Drug may cause diarrhea, fatigue, headache, or depression. Advise diabetic patient to carefully monitor blood glucose as drug may mask symptoms of hypoglycemia. Patient should take extended-release tablet with or immediately following meals. Counsel patient against sudden discontinuation of drug, as this may precipitate hypertension, angina, or myocardial infarction. In the event of a missed dose, counsel patient to skip the missed dose and maintain a regular dosing schedule. Torsemide  Side effects may include excessive urination.  Tell patient to report symptoms of ototoxicity.  Instruct patient to report lightheadedness or syncope.  Warn patient to avoid use of nonprescription NSAID products without first discussing it with their healthcare provider.  DRUGS TO AVOID IN HEART FAILURE  Drug or Class Mechanism  Analgesics . NSAIDs . COX-2 inhibitors . Glucocorticoids  Sodium and water retention, increased systemic vascular resistance, decreased response to diuretics   Diabetes Medications . Metformin . Thiazolidinediones o Rosiglitazone (Avandia) o Pioglitazone (Actos) . DPP4 Inhibitors o Saxagliptin  (Onglyza) o Sitagliptin (Januvia)   Lactic acidosis Possible calcium channel blockade   Unknown  Antiarrhythmics . Class I  o Flecainide o Disopyramide . Class III o Sotalol . Other o Dronedarone  Negative inotrope, proarrhythmic   Proarrhythmic, beta blockade  Negative inotrope  Antihypertensives . Alpha Blockers o Doxazosin . Calcium Channel Blockers o Diltiazem o Verapamil o Nifedipine . Central Alpha Adrenergics o Moxonidine . Peripheral Vasodilators o Minoxidil  Increases renin and aldosterone  Negative inotrope    Possible sympathetic withdrawal  Unknown  Anti-infective . Itraconazole . Amphotericin B  Negative inotrope Unknown  Hematologic . Anagrelide . Cilostazol   Possible inhibition of PD IV Inhibition of PD III causing arrhythmias  Neurologic/Psychiatric . Stimulants . Anti-Seizure Drugs o Carbamazepine o Pregabalin . Antidepressants o Tricyclics o Citalopram . Parkinsons o Bromocriptine o Pergolide o Pramipexole . Antipsychotics o Clozapine . Antimigraine o Ergotamine o Methysergide . Appetite suppressants . Bipolar o Lithium  Peripheral alpha and beta agonist activity  Negative inotrope and chronotrope Calcium channel blockade  Negative inotrope, proarrhythmic Dose-dependent QT  prolongation  Excessive serotonin activity/valvular damage Excessive serotonin activity/valvular damage Unknown  IgE mediated hypersensitivy, calcium channel blockade  Excessive serotonin activity/valvular damage Excessive serotonin activity/valvular damage Valvular damage  Direct myofibrillar degeneration, adrenergic stimulation  Antimalarials . Chloroquine . Hydroxychloroquine Intracellular inhibition of lysosomal enzymes  Urologic Agents . Alpha Blockers o Doxazosin o Prazosin o Tamsulosin o Terazosin  Increased renin and aldosterone  Adapted from Page RL, et al. "Drugs That May Cause or Exacerbate Heart Failure: A Scientific  Statement from the American Heart  Association." Circulation 2016; 134:e32-e69. DOI: 10.1161/CIR.0000000000000426   MEDICATION ADHERENCES TIPS AND STRATEGIES 1. Taking medication as prescribed improves patient outcomes in heart failure (reduces hospitalizations, improves symptoms, increases survival) 2. Side effects of medications can be managed by decreasing doses, switching agents, stopping drugs, or adding additional therapy. Please let someone in the Heart Failure Clinic know if you have having bothersome side effects so we can modify your regimen. Do not alter your medication regimen without talking to [place].  3. Medication reminders can help patients remember to take drugs on time. If you are missing or forgetting doses you can try linking behaviors, using pill boxes, or an electronic reminder like an alarm on your phone or an app. Some people can also get automated phone calls as medication reminders.

## 2020-06-19 NOTE — Patient Instructions (Addendum)
Resume weighing daily and call for an overnight weight gain of > 2 pounds or a weekly weight gain of >5 pounds.   Use Douglas Vance for your seasoning

## 2020-06-23 ENCOUNTER — Ambulatory Visit: Payer: Medicare Other | Admitting: Cardiology

## 2020-06-30 ENCOUNTER — Encounter: Payer: Self-pay | Admitting: Cardiology

## 2020-06-30 ENCOUNTER — Other Ambulatory Visit: Payer: Self-pay

## 2020-06-30 ENCOUNTER — Inpatient Hospital Stay: Payer: Medicare Other | Attending: Oncology | Admitting: Oncology

## 2020-06-30 ENCOUNTER — Inpatient Hospital Stay: Payer: Medicare Other

## 2020-06-30 ENCOUNTER — Ambulatory Visit (INDEPENDENT_AMBULATORY_CARE_PROVIDER_SITE_OTHER): Payer: Medicare Other | Admitting: Cardiology

## 2020-06-30 VITALS — BP 111/56 | HR 60 | Resp 20 | Ht 66.14 in | Wt 160.1 lb

## 2020-06-30 VITALS — BP 108/50 | HR 58 | Ht 67.0 in | Wt 160.0 lb

## 2020-06-30 DIAGNOSIS — I110 Hypertensive heart disease with heart failure: Secondary | ICD-10-CM | POA: Diagnosis not present

## 2020-06-30 DIAGNOSIS — R918 Other nonspecific abnormal finding of lung field: Secondary | ICD-10-CM | POA: Diagnosis not present

## 2020-06-30 DIAGNOSIS — Z87891 Personal history of nicotine dependence: Secondary | ICD-10-CM

## 2020-06-30 DIAGNOSIS — I5023 Acute on chronic systolic (congestive) heart failure: Secondary | ICD-10-CM | POA: Diagnosis not present

## 2020-06-30 DIAGNOSIS — I2510 Atherosclerotic heart disease of native coronary artery without angina pectoris: Secondary | ICD-10-CM | POA: Diagnosis not present

## 2020-06-30 DIAGNOSIS — N400 Enlarged prostate without lower urinary tract symptoms: Secondary | ICD-10-CM | POA: Diagnosis not present

## 2020-06-30 DIAGNOSIS — J948 Other specified pleural conditions: Secondary | ICD-10-CM

## 2020-06-30 DIAGNOSIS — Z7982 Long term (current) use of aspirin: Secondary | ICD-10-CM

## 2020-06-30 DIAGNOSIS — I5020 Unspecified systolic (congestive) heart failure: Secondary | ICD-10-CM

## 2020-06-30 DIAGNOSIS — I10 Essential (primary) hypertension: Secondary | ICD-10-CM | POA: Diagnosis not present

## 2020-06-30 DIAGNOSIS — Z8616 Personal history of COVID-19: Secondary | ICD-10-CM | POA: Diagnosis not present

## 2020-06-30 DIAGNOSIS — Z79899 Other long term (current) drug therapy: Secondary | ICD-10-CM

## 2020-06-30 MED ORDER — ASPIRIN EC 81 MG PO TBEC: 81.0000 mg | DELAYED_RELEASE_TABLET | Freq: Every day | ORAL | 3 refills | Status: DC

## 2020-06-30 MED ORDER — METOPROLOL SUCCINATE ER 25 MG PO TB24: 12.5000 mg | ORAL_TABLET | Freq: Every day | ORAL | Status: DC

## 2020-06-30 NOTE — Patient Instructions (Signed)
Medication Instructions:   Your physician has recommended you make the following change in your medication:   1.  START taking Aspirin 81 MG daily  2.  DECREASE your Toprol XL to 12.5 MG daily.  *If you need a refill on your cardiac medications before your next appointment, please call your pharmacy*   Lab Work: None ordered If you have labs (blood work) drawn today and your tests are completely normal, you will receive your results only by: Marland Kitchen MyChart Message (if you have MyChart) OR . A paper copy in the mail If you have any lab test that is abnormal or we need to change your treatment, we will call you to review the results.   Testing/Procedures: None ordered   Follow-Up: At [facility], you and your health needs are our priority.  As part of our continuing mission to provide you with exceptional heart care, we have created designated Provider Care Teams.  These Care Teams include your primary Cardiologist (physician) and Advanced Practice Providers (APPs -  Physician Assistants and Nurse Practitioners) who all work together to provide you with the care you need, when you need it.  We recommend signing up for the patient portal called "MyChart".  Sign up information is provided on this After Visit Summary.  MyChart is used to connect with patients for Virtual Visits (Telemedicine).  Patients are able to view lab/test results, encounter notes, upcoming appointments, etc.  Non-urgent messages can be sent to your provider as well.   To learn more about what you can do with MyChart, go to [email or web address].    Your next appointment:   6 month(s)  The format for your next appointment:   In Person  Provider:   Debbe Odea, MD   Other Instructions

## 2020-06-30 NOTE — Progress Notes (Signed)
Hematology/Oncology Consult note [facility] [phone] [phone]  Patient Care Team: [facility] as PCP - General (Geriatric Medicine)   Name of the patient: Douglas Vance  [id number]  1934/07/15    Reason for referral-pleural mass   Referring physician-Dr. Chipper Herb  Date of visit: 06/30/20   History of presenting illness- Patient is a 86 year old male with a past medical history significant for smoking but he quit in 2000.  Prior to that he smoked 1 pack of cigarettes per day for over 40 years.  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  He was admitted to the ER in April 2022 for symptoms of acute hypoxic respiratory failure requiring 15 L of nonrebreather secondary to COVID-pneumonia complicated by aspiration.  He required BiPAP in the ICU and eventually recovered and has been off oxygen.  During his hospitalization patient underwent a CT angio chest which showed bilateral calcified pleural plaques with prior asbestos exposure.  Nodular soft tissue pleural masses along the left lateral chest wall measuring 2.4 x 1.2 cm and 0.7 x 1.8 cm.  Bilateral airspace opacities.  These pleural-based masses were reported to be focal scarring versus malignancy.  Age indeterminate T4 vertebral compression fracture.  Patient referred for further management  Currently patient reports that his exertional shortness of breath and fatigue is slowly improving.  He is off oxygen.  Has occasional clear sputum production  ECOG PS- 2  Pain scale- 0   Review of systems- Review of Systems  Constitutional: Positive for malaise/fatigue. Negative for chills, fever and weight loss.  HENT: Negative for congestion, ear discharge and nosebleeds.   Eyes: Negative for blurred vision.  Respiratory: Positive for sputum production and shortness of breath. Negative for cough, hemoptysis and wheezing.   Cardiovascular: Negative for chest  pain, palpitations, orthopnea and claudication.  Gastrointestinal: Negative for abdominal pain, blood in stool, constipation, diarrhea, heartburn, melena, nausea and vomiting.  Genitourinary: Negative for dysuria, flank pain, frequency, hematuria and urgency.  Musculoskeletal: Negative for back pain, joint pain and myalgias.  Skin: Negative for rash.  Neurological: Negative for dizziness, tingling, focal weakness, seizures, weakness and headaches.  Endo/Heme/Allergies: Does not bruise/bleed easily.  Psychiatric/Behavioral: Negative for depression and suicidal ideas. The patient does not have insomnia.     Allergies  Allergen Reactions  . Cipro [Ciprofloxacin Hcl]     Patient Active Problem List   Diagnosis Date Noted  . Malnutrition of moderate degree 06/09/2020  . Elevated troponin   . CAD (coronary artery disease) 06/06/2020  . HTN (hypertension) 06/06/2020  . History of GI bleed 06/06/2020  . Pneumonia due to COVID-19 virus 06/06/2020  . Acute respiratory failure with hypoxia (HCC) 06/06/2020  . Acute respiratory failure with hypoxemia (HCC) 06/06/2020  . Acute on chronic systolic CHF (congestive heart failure) (HCC) 06/06/2020  . Lactic acidosis 06/06/2020     Past Medical History:  Diagnosis Date  . BPH (benign prostatic hyperplasia)   . CHF (congestive heart failure) (HCC)   . Coronary artery disease   . Hypertension   . Myocardial infarction [facility])      Past Surgical History:  Procedure Laterality Date  . CARDIAC CATHETERIZATION    . VASECTOMY      Social History   Socioeconomic History  . Marital status: Widowed    Spouse name: Not on file  . Number of children: Not on file  . Years of education: Not on file  . Highest education level: Not on  file  Occupational History  . Not on file  Tobacco Use  . Smoking status: Former Games developermoker  . Smokeless tobacco: Never Used  Vaping Use  . Vaping Use: Never used  Substance and Sexual Activity  . Alcohol use: Not  Currently  . Drug use: Never  . Sexual activity: Not on file  Other Topics Concern  . Not on file  Social History Narrative  . Not on file   Social Determinants of Health   Financial Resource Strain: Not on file  Food Insecurity: Not on file  Transportation Needs: Not on file  Physical Activity: Not on file  Stress: Not on file  Social Connections: Not on file  Intimate Partner Violence: Not on file     No family history on file.   Current Outpatient Medications:  .  albuterol (VENTOLIN HFA) 108 (90 Base) MCG/ACT inhaler, Inhale 1 puff into the lungs every 4 (four) hours., Disp: , Rfl:  .  aspirin EC 81 MG tablet, Take 1 tablet (81 mg total) by mouth daily. Swallow whole., Disp: 90 tablet, Rfl: 3 .  donepezil (ARICEPT) 5 MG tablet, Take 5 mg by mouth at bedtime., Disp: , Rfl:  .  levothyroxine (SYNTHROID) 50 MCG tablet, Take 50 mcg by mouth every morning., Disp: , Rfl:  .  lovastatin (MEVACOR) 20 MG tablet, Take 20 mg by mouth daily with supper., Disp: , Rfl:  .  metoprolol succinate (TOPROL-XL) 25 MG 24 hr tablet, Take 0.5 tablets (12.5 mg total) by mouth daily., Disp: 30 tablet, Rfl:  .  omeprazole (PRILOSEC) 20 MG capsule, Take 20 mg by mouth daily., Disp: , Rfl:  .  sertraline (ZOLOFT) 50 MG tablet, Take 50 mg by mouth daily., Disp: , Rfl:  .  tamsulosin (FLOMAX) 0.4 MG CAPS capsule, Take 0.4 mg by mouth daily., Disp: , Rfl:  .  torsemide (DEMADEX) 20 MG tablet, Take 20 mg by mouth daily., Disp: , Rfl:  .  traZODone (DESYREL) 50 MG tablet, Take 50 mg by mouth at bedtime., Disp: , Rfl:  .  isosorbide mononitrate (IMDUR) 30 MG 24 hr tablet, Take 1 tablet (30 mg total) by mouth daily., Disp: 30 tablet, Rfl: 5   Physical exam:  Vitals:   06/30/20 1107  BP: (!) 111/56  Pulse: 60  Resp: 20  SpO2: 100%  Weight: 160 lb 1.6 oz (72.6 kg)  Height: 5' 6.14" (1.68 m)   Physical Exam Constitutional:      General: He is not in acute distress. Cardiovascular:     Rate and  Rhythm: Normal rate and regular rhythm.     Heart sounds: Normal heart sounds.  Pulmonary:     Effort: Pulmonary effort is normal.     Breath sounds: Normal breath sounds.  Abdominal:     General: Bowel sounds are normal.     Palpations: Abdomen is soft.  Skin:    General: Skin is warm and dry.  Neurological:     Mental Status: He is alert and oriented to person, place, and time.        CMP Latest Ref Rng & Units 06/19/2020  Glucose 70 - 99 mg/dL 161(W122(H)  BUN 8 - 23 mg/dL 96(E31(H)  Creatinine 4.540.61 - 1.24 mg/dL 0.980.93  Sodium 119135 - 147145 mmol/L 134(L)  Potassium 3.5 - 5.1 mmol/L 3.2(L)  Chloride 98 - 111 mmol/L 92(L)  CO2 22 - 32 mmol/L 33(H)  Calcium 8.9 - 10.3 mg/dL 9.0  Total Protein 6.5 - 8.1 g/dL -  Total Bilirubin 0.3 - 1.2 mg/dL -  Alkaline Phos 38 - 542 U/L -  AST 15 - 41 U/L -  ALT 0 - 44 U/L -   CBC Latest Ref Rng & Units 06/08/2020  WBC 4.0 - 10.5 K/uL 9.4  Hemoglobin 13.0 - 17.0 g/dL 70.6  Hematocrit 23.7 - 52.0 % 40.7  Platelets 150 - 400 K/uL 182    No images are attached to the encounter.  CT ANGIO CHEST PE W OR WO CONTRAST  Result Date: 06/06/2020 CLINICAL DATA:  Shortness of breath EXAM: CT ANGIOGRAPHY CHEST WITH CONTRAST TECHNIQUE: Multidetector CT imaging of the chest was performed using the standard protocol during bolus administration of intravenous contrast. Multiplanar CT image reconstructions and MIPs were obtained to evaluate the vascular anatomy. CONTRAST:  OMNIPAQUE IOHEXOL 350 MG/ML SOLN COMPARISON:  None. FINDINGS: Cardiovascular: Satisfactory opacification of the pulmonary arteries to the segmental level. No evidence of pulmonary embolism. Mild cardiomegaly. No pericardial effusion. Coronary artery atherosclerosis. Thoracic aortic atherosclerosis. Mediastinum/Nodes: No enlarged mediastinal, hilar, or axillary lymph nodes. Thyroid gland, trachea, and esophagus demonstrate no significant findings. Lungs/Pleura: Small bilateral pleural effusions.  Bilateral calcified pleural plaques as can be seen with prior asbestos exposure or infection. Nodular soft tissue pleural masses along the left lateral chest wall measuring 2.4 x 1.2 cm 0.7 x 1.8 cm respectively. Bilateral lower lobe, right middle lobe and bilateral upper lobe interstitial and alveolar airspace disease most concerning for multilobar pneumonia including atypical viral pneumonia such as COVID 19. No pneumothorax. Upper Abdomen: No acute abnormality. Musculoskeletal: No acute osseous abnormality. No aggressive osseous lesion. Age-indeterminate T4 vertebral body compression fracture. Review of the MIP images confirms the above findings. IMPRESSION: 1. No evidence of pulmonary embolus. 2. Bilateral lower lobe, right middle lobe and bilateral upper lobe interstitial and alveolar airspace disease most concerning for multilobar pneumonia including atypical viral pneumonia such as COVID 19. 3. Small bilateral pleural effusions. 4. Bilateral calcified pleural plaques as can be seen with prior asbestos exposure or infection. Nodular soft tissue pleural masses along the left lateral chest wall measuring 2.4 x 1.2 cm 0.7 x 1.8 cm respectively which may reflect focal scarring versus malignancy. Recommend follow-up CT of the chest with intravenous contrast following the patient's acute illness. 5. Age-indeterminate T4 vertebral body compression fracture. 6. Aortic Atherosclerosis (ICD10-I70.0). Electronically Signed   By: Elige Ko   On: 06/06/2020 18:13   DG Chest Port 1 View  Result Date: 06/06/2020 CLINICAL DATA:  Shortness of breath and decreased oxygenation. Two week history of shortness of breath worsening tonight. Vomiting tonight. EXAM: PORTABLE CHEST 1 VIEW COMPARISON:  None. FINDINGS: Shallow inspiration. Heart size appears enlarged. Diffuse bilateral airspace disease in the lungs more prominent on the right. Suggestion of small pleural effusions. Changes may represent edema, aspiration, or  multifocal pneumonia. IMPRESSION: Cardiac enlargement with diffuse bilateral airspace disease and small pleural effusions. Electronically Signed   By: Burman Nieves M.D.   On: 06/06/2020 02:45    Assessment and plan- Patient is a 86 y.o. male referred for left chest wall pleural-based mass  I have reviewed CT chest images independently and discussed findings with the patient.  Patient noted to have 2 adjacent left lateral chest wall masses 1 measuring 2.4 x 1.2 cm and the other measuring 0.7 x 1.8 cm.  We do not have any prior CTs for comparison.  Unclear at this represents scarring/or inflammation versus malignancy.  I am inclined to repeat his CT scan in 6 to  7 weeks before deciding about PET scan or biopsy.  However I would like to discuss his case at tumor board and get consensus from radiology.  We will call the patient after discussion at tumor board.  Patient recently recovered from severe acute hypoxic respiratory failure secondary to COVID and aspiration pneumonia and CT-guided biopsy done too quickly could be a risky proposition.  Patient and his daughter verbalized understanding of the plan   Thank you for this kind referral and the opportunity to participate in the care of this patient   Visit Diagnosis 1. Pleural mass     Dr. Owens Shark, MD, MPH [facility] at [facility] [phone] 06/30/2020  4:27 PM

## 2020-06-30 NOTE — Progress Notes (Signed)
Cardiology Office Note:    Date:  06/30/2020   ID:  Douglas Vance, DOB 1934-05-16, MRN [id number]  PCP:  [facility]  [facility] Cardiologist:  None  [facility] Electrophysiologist:  None   Referring MD: [facility]*   Chief Complaint  Patient presents with  . OTher    6 week follow up. Patient c.o SOB. Meds reviewed verbally with patient.     History of Present Illness:    Douglas Vance is a 86 y.o. male with a hx of hypertension, MI/CAD (s/p PCI x 9), who presents for follow-up.  Recently admitted for multilobar pneumonia and also COVID-19.  Managed with antibiotics.  Feels well, denies chest pain.  BP low normal.  Tolerating current medications including Imdur, Toprol-XL.  For some reason aspirin was DC'd when patient left hospital.  CBC reviewed, showing normal H&H.  Toprol-XL was increased to 25 mg daily upon discharge.   Prior notes Echo 03/13/2020, EF 30 to 35% States having a history of MI, first occurrence in 2000.  He was seen in [facility] where stent was placed.  Over the past several years, he has had multiple PCI's, totaling 9.  Last PCI was 3 years ago by former cardiologist.  He moved to the area to be close to daughter after he lost his wife in August 2021.  patient has a history of GI bleeds, EGD and colonoscopy only revealed gastritis.  Has a history of significant nosebleeds, causing patient to stop Plavix. .   Past Medical History:  Diagnosis Date  . BPH (benign prostatic hyperplasia)   . CHF (congestive heart failure) (HCC)   . Coronary artery disease   . Hypertension   . Myocardial infarction [facility])     Past Surgical History:  Procedure Laterality Date  . CARDIAC CATHETERIZATION    . VASECTOMY      Current Medications: Current Meds  Medication Sig  . albuterol (VENTOLIN HFA) 108 (90 Base) MCG/ACT inhaler Inhale 1 puff into the lungs every 4 (four) hours.  Marland Kitchen aspirin EC 81 MG tablet Take 1  tablet (81 mg total) by mouth daily. Swallow whole.  . donepezil (ARICEPT) 5 MG tablet Take 5 mg by mouth at bedtime.  . isosorbide mononitrate (IMDUR) 30 MG 24 hr tablet Take 1 tablet (30 mg total) by mouth daily.  Marland Kitchen levothyroxine (SYNTHROID) 50 MCG tablet Take 50 mcg by mouth every morning.  . lovastatin (MEVACOR) 20 MG tablet Take 20 mg by mouth daily with supper.  Marland Kitchen omeprazole (PRILOSEC) 20 MG capsule Take 20 mg by mouth daily.  . sertraline (ZOLOFT) 50 MG tablet Take 50 mg by mouth daily.  . tamsulosin (FLOMAX) 0.4 MG CAPS capsule Take 0.4 mg by mouth daily.  Marland Kitchen torsemide (DEMADEX) 20 MG tablet Take 20 mg by mouth daily.  . traZODone (DESYREL) 50 MG tablet Take 50 mg by mouth at bedtime.  . [DISCONTINUED] metoprolol succinate (TOPROL-XL) 25 MG 24 hr tablet Take 25 mg by mouth daily.     Allergies:   Cipro [ciprofloxacin hcl]   Social History   Socioeconomic History  . Marital status: Widowed    Spouse name: Not on file  . Number of children: Not on file  . Years of education: Not on file  . Highest education level: Not on file  Occupational History  . Not on file  Tobacco Use  . Smoking status: Former Games developer  . Smokeless tobacco: Never Used  Vaping Use  . Vaping Use:  Never used  Substance and Sexual Activity  . Alcohol use: Not Currently  . Drug use: Never  . Sexual activity: Not on file  Other Topics Concern  . Not on file  Social History Narrative  . Not on file   Social Determinants of Health   Financial Resource Strain: Not on file  Food Insecurity: Not on file  Transportation Needs: Not on file  Physical Activity: Not on file  Stress: Not on file  Social Connections: Not on file     Family History: The patient's family history is not on file.  ROS:   Please see the history of present illness.     All other systems reviewed and are negative.  EKGs/Labs/Other Studies Reviewed:    The following studies were reviewed today:   EKG:  EKG id ordered  today.  EKG shows sinus bradycardia, left bundle branch block, first-degree AV block.  Recent Labs: 06/06/2020: B Natriuretic Peptide 1,222.8 06/08/2020: Hemoglobin 13.7; Platelets 182 06/10/2020: Magnesium 1.8 06/11/2020: ALT 35 06/19/2020: BUN 31; Creatinine, Ser 0.93; Potassium 3.2; Sodium 134  Recent Lipid Panel    Component Value Date/Time   CHOL 130 06/06/2020 1845   TRIG 54 06/06/2020 1845   HDL 56 06/06/2020 1845   CHOLHDL 2.3 06/06/2020 1845   VLDL 11 06/06/2020 1845   LDLCALC 63 06/06/2020 1845     Risk Assessment/Calculations:     Physical Exam:    VS:  BP (!) 108/50 (BP Location: Left Arm, Patient Position: Sitting, Cuff Size: Normal)   Pulse (!) 58   Ht 5\' 7"  (1.702 m)   Wt 160 lb (72.6 kg)   SpO2 92%   BMI 25.06 kg/m     Wt Readings from Last 3 Encounters:  06/30/20 160 lb (72.6 kg)  06/19/20 154 lb 8 oz (70.1 kg)  06/11/20 158 lb 15.2 oz (72.1 kg)     GEN:  Well nourished, well developed in no acute distress HEENT: Normal NECK: No JVD; No carotid bruits LYMPHATICS: No lymphadenopathy CARDIAC: RRR, 2/6 systolic murmur RESPIRATORY:  Clear to auscultation without rales, wheezing or rhonchi  ABDOMEN: Soft, non-tender, non-distended MUSCULOSKELETAL:  No edema; No deformity  SKIN: Warm and dry NEUROLOGIC:  Alert and oriented x 3 PSYCHIATRIC:  Normal affect   ASSESSMENT:    1. CAD in native artery   2. Primary hypertension   3. HFrEF (heart failure with reduced ejection fraction) (HCC)    PLAN:    In order of problems listed above:  1. History of CAD/PCI x 9.  Denies chest pain, tolerating Imdur well.  Start aspirin 81 mg daily, continue Imdur, Toprol-XL as below. 2. Hypertension, BP is low normal.  Reduce Toprol-XL to 12.5 mg daily.  Continue Imdur for antianginal benefit. 3. HFrEF, likely ischemic in origin.  Low blood pressures preventing CHF medication use.  He is euvolemic.  Decrease Toprol-XL, continue torsemide 20mg  qd.   Follow-up in 6  months   Medication Adjustments/Labs and Tests Ordered: Current medicines are reviewed at length with the patient today.  Concerns regarding medicines are outlined above.  No orders of the defined types were placed in this encounter.  Meds ordered this encounter  Medications  . metoprolol succinate (TOPROL-XL) 25 MG 24 hr tablet    Sig: Take 0.5 tablets (12.5 mg total) by mouth daily.    Dispense:  30 tablet  . aspirin EC 81 MG tablet    Sig: Take 1 tablet (81 mg total) by mouth daily. Swallow whole.  Dispense:  90 tablet    Refill:  3    Patient Instructions  Medication Instructions:   Your physician has recommended you make the following change in your medication:   1.  START taking Aspirin 81 MG daily  2.  DECREASE your Toprol XL to 12.5 MG daily.  *If you need a refill on your cardiac medications before your next appointment, please call your pharmacy*   Lab Work: None ordered If you have labs (blood work) drawn today and your tests are completely normal, you will receive your results only by: Marland Kitchen MyChart Message (if you have MyChart) OR . A paper copy in the mail If you have any lab test that is abnormal or we need to change your treatment, we will call you to review the results.   Testing/Procedures: None ordered   Follow-Up: At [facility], you and your health needs are our priority.  As part of our continuing mission to provide you with exceptional heart care, we have created designated Provider Care Teams.  These Care Teams include your primary Cardiologist (physician) and Advanced Practice Providers (APPs -  Physician Assistants and Nurse Practitioners) who all work together to provide you with the care you need, when you need it.  We recommend signing up for the patient portal called "MyChart".  Sign up information is provided on this After Visit Summary.  MyChart is used to connect with patients for Virtual Visits (Telemedicine).  Patients are able to view  lab/test results, encounter notes, upcoming appointments, etc.  Non-urgent messages can be sent to your provider as well.   To learn more about what you can do with MyChart, go to [email or web address].    Your next appointment:   6 month(s)  The format for your next appointment:   In Person  Provider:   Debbe Odea, MD   Other Instructions       Signed, Debbe Odea, MD  06/30/2020 11:04 AM    [facility]

## 2020-07-03 ENCOUNTER — Encounter: Payer: Self-pay | Admitting: *Deleted

## 2020-07-03 ENCOUNTER — Other Ambulatory Visit: Payer: Medicare Other

## 2020-07-03 DIAGNOSIS — J948 Other specified pleural conditions: Secondary | ICD-10-CM

## 2020-07-03 NOTE — Progress Notes (Signed)
Tumor Board Documentation  Douglas Vance was presented by Dr Smith Robert at our Tumor Board on 07/03/2020, which included representatives from medical oncology,radiation oncology,internal medicine,navigation,pathology,radiology,surgical,pharmacy,genetics,research,palliative care.  Douglas Vance currently presents as a new patient,for discussion with history of the following treatments: active survellience.  Additionally, we reviewed previous medical and familial history, history of present illness, and recent lab results along with all available histopathologic and imaging studies. The tumor board considered available treatment options and made the following recommendations: Active surveillance (PET in August 2022)    The following procedures/referrals were also placed: No orders of the defined types were placed in this encounter.   Clinical Trial Status: not discussed   Staging used: Not Applicable  AJCC Staging:       Group: Mass Chest Wall   National site-specific guidelines   were discussed with respect to the case.  Tumor board is a meeting of clinicians from various specialty areas who evaluate and discuss patients for whom a multidisciplinary approach is being considered. Final determinations in the plan of care are those of the provider(s). The responsibility for follow up of recommendations given during tumor board is that of the provider.   Today's extended care, comprehensive team conference, Douglas Vance was not present for the discussion and was not examined.   Multidisciplinary Tumor Board is a multidisciplinary case peer review process.  Decisions discussed in the Multidisciplinary Tumor Board reflect the opinions of the specialists present at the conference without having examined the patient.  Ultimately, treatment and diagnostic decisions rest with the primary provider(s) and the patient.

## 2020-07-03 NOTE — Progress Notes (Signed)
Recommendations received from case conference for pt to have PET scan early August with follow up with Dr. Smith Robert after PET and tumor board discussion. Orders placed.   Left message with pt and his daughter with the above recommendations. Contact info given and instructed to call back with any further questions or needs. Informed that will be called once follow up appts have been scheduled.

## 2020-07-09 ENCOUNTER — Telehealth: Payer: Self-pay | Admitting: *Deleted

## 2020-07-09 NOTE — Telephone Encounter (Signed)
Called and spoke with Gavin Pound regarding recommendations from case conference last week. Reviewed upcoming appts for PET scan and follow up with Dr. Smith Robert in Aug[place]avin Pound in agreement with recommendations and confirmed appts.

## 2020-07-18 ENCOUNTER — Telehealth: Payer: Self-pay | Admitting: Family

## 2020-07-18 NOTE — Telephone Encounter (Signed)
Opened in error

## 2020-07-20 NOTE — Progress Notes (Signed)
Patient ID: Douglas Vance, male    DOB: 07-05-1934, 86 y.o.   MRN: [id number]  HPI  Douglas Vance is a 86 y/o male with a history of CAD, HTN, BPH, previous tobacco use and chronic heart failure.   Echo report from 03/13/20 reviewed and showed an EF of 30-35% along with moderate LAE and mild Douglas.   Admitted 06/06/20 due to acute on chronic HF along with Covid pneumonia. Cardiology consult obtained. Initially hypoxic requiring 15L. Also required bipap and treatment with IV lasix, remdesivir, steroids, and empiric antibiotics. Able to be weaned off oxygen. Troponin elevation thought to be due to demand ischemia. Discharged after 5 days.   He presents today for a follow-up visit with a chief complaint of minimal shortness of breath upon moderate exertion. He describes this as chronic in nature having been present for several years. He has associated fatigue, cough, difficulty sleeping and gradual weight gain along with this. He denies any dizziness, chest pain, pedal edema, palpitations or abdominal distention.   He says that he's recently had lab work drawn by his PCP.   Past Medical History:  Diagnosis Date   BPH (benign prostatic hyperplasia)    CHF (congestive heart failure) (HCC)    Coronary artery disease    Hypertension    Myocardial infarction [facility])    Past Surgical History:  Procedure Laterality Date   CARDIAC CATHETERIZATION     VASECTOMY     No family history on file. Social History   Tobacco Use   Smoking status: Former    Pack years: 0.00   Smokeless tobacco: Never  Substance Use Topics   Alcohol use: Not Currently   Allergies  Allergen Reactions   Cipro [Ciprofloxacin Hcl]    Prior to Admission medications   Medication Sig Start Date End Date Taking? Authorizing Provider  albuterol (VENTOLIN HFA) 108 (90 Base) MCG/ACT inhaler Inhale 1 puff into the lungs every 4 (four) hours. 05/22/20  Yes [provider]  aspirin EC 81 MG tablet Take 1 tablet (81 mg total)  by mouth daily. Swallow whole. 06/30/20  Yes Agbor-Etang, Arlys John, MD  donepezil (ARICEPT) 5 MG tablet Take 5 mg by mouth at bedtime.   Yes [provider]  guaiFENesin (MUCINEX) 600 MG 12 hr tablet Take 600 mg by mouth 2 (two) times daily.   Yes [provider]  isosorbide mononitrate (IMDUR) 30 MG 24 hr tablet Take 1 tablet (30 mg total) by mouth daily. 03/18/20 07/21/20 Yes Agbor-Etang, Arlys John, MD  levothyroxine (SYNTHROID) 50 MCG tablet Take 50 mcg by mouth every morning. 04/14/20  Yes [provider]  lovastatin (MEVACOR) 20 MG tablet Take 20 mg by mouth daily with supper.   Yes [provider]  metoprolol succinate (TOPROL-XL) 25 MG 24 hr tablet Take 0.5 tablets (12.5 mg total) by mouth daily. 06/30/20  Yes Agbor-Etang, Arlys John, MD  omeprazole (PRILOSEC) 20 MG capsule Take 20 mg by mouth daily.   Yes [provider]  sertraline (ZOLOFT) 50 MG tablet Take 100 mg by mouth daily. 04/14/20  Yes [provider]  tamsulosin (FLOMAX) 0.4 MG CAPS capsule Take 0.4 mg by mouth daily.   Yes [provider]  torsemide (DEMADEX) 20 MG tablet Take 20 mg by mouth daily.   Yes [provider]  traZODone (DESYREL) 50 MG tablet Take 50 mg by mouth at bedtime. 04/14/20  Yes [provider]    Review of Systems  Constitutional:  Positive for fatigue. Negative for appetite  change.  HENT:  Negative for congestion, postnasal drip and sore throat.   Eyes: Negative.   Respiratory:  Positive for cough and shortness of breath (minimal).   Cardiovascular:  Negative for chest pain, palpitations and leg swelling.  Gastrointestinal:  Negative for abdominal distention and abdominal pain.  Endocrine: Negative.   Genitourinary: Negative.   Musculoskeletal:  Negative for back pain and neck pain.  Skin: Negative.   Allergic/Immunologic: Negative.   Neurological:  Negative for dizziness and light-headedness.  Hematological:  Does not bruise/bleed easily.   Psychiatric/Behavioral:  Positive for sleep disturbance (not sleeping well). Negative for dysphoric mood. The patient is not nervous/anxious.    Vitals:   07/21/20 0912  BP: 126/60  Pulse: 62  Resp: 18  SpO2: 96%  Weight: 160 lb 6 oz (72.7 kg)  Height: 5\' 6"  (1.676 m)   Wt Readings from Last 3 Encounters:  07/21/20 160 lb 6 oz (72.7 kg)  06/30/20 160 lb 1.6 oz (72.6 kg)  06/30/20 160 lb (72.6 kg)   Lab Results  Component Value Date   CREATININE 0.93 06/19/2020   CREATININE 0.88 06/11/2020   CREATININE 0.69 06/10/2020   Physical Exam Vitals and nursing note reviewed. Exam conducted with a chaperone present (daughter).  Constitutional:      Appearance: He is well-developed.  HENT:     Head: Normocephalic and atraumatic.  Neck:     Vascular: No JVD.  Cardiovascular:     Rate and Rhythm: Normal rate and regular rhythm.  Pulmonary:     Effort: Pulmonary effort is normal. No respiratory distress.     Breath sounds: No wheezing or rales.  Abdominal:     Palpations: Abdomen is soft.     Tenderness: There is no abdominal tenderness.  Musculoskeletal:     Cervical back: Neck supple.     Right lower leg: No tenderness. No edema.     Left lower leg: No tenderness. No edema.  Skin:    General: Skin is warm and dry.  Neurological:     General: No focal deficit present.     Mental Status: He is alert and oriented to person, place, and time.  Psychiatric:        Mood and Affect: Mood normal.        Behavior: Behavior normal.   Assessment & Plan:  1: Chronic heart failure with reduced ejection fraction- - NYHA class II - euvolemic today - weighing daily; reminded to call for an overnight weight gain of > 2 pounds or a weekly weight gain of > 5 pounds - weight up 6 pounds from last visit here 1 month ago - not adding salt; reports his appetite has improved - saw cardiology (Agbor-Etang) 06/30/20 - has been on lisinopril in the past and thinks it was stopped due to  hypotension; could consider trying entresto at future visits - currently on GDMT of metoprolol although HR doesn't allow for titration today - BNP 06/06/20 was 1222.8 - PharmD reconciled medications with the patient and his daughter  2: HTN- - BP looks good today - PCP is [facility] - BMP 06/19/20 reviewed and showed sodium 134, potassium 3.2, creatinine 0.93 and GFR >60 - has had recent lab work done via PCP   3: Emphysema- - saw pulmonology 08/19/20) 07/25/19; returns August 2022 - has albuterol inhaler that he uses PRN   Medication bottles reviewed.   Due to HF stability will not make a return appointment at this time. Advised patient that  he could call back at anytime to make another appointment and he was comfortable with this plan.

## 2020-07-21 ENCOUNTER — Other Ambulatory Visit: Payer: Self-pay

## 2020-07-21 ENCOUNTER — Encounter: Payer: Self-pay | Admitting: Family

## 2020-07-21 ENCOUNTER — Ambulatory Visit: Payer: Medicare Other | Attending: Family | Admitting: Family

## 2020-07-21 ENCOUNTER — Encounter: Payer: Self-pay | Admitting: Pharmacist

## 2020-07-21 VITALS — BP 126/60 | HR 62 | Resp 18 | Ht 66.0 in | Wt 160.4 lb

## 2020-07-21 DIAGNOSIS — Z87891 Personal history of nicotine dependence: Secondary | ICD-10-CM | POA: Insufficient documentation

## 2020-07-21 DIAGNOSIS — I5022 Chronic systolic (congestive) heart failure: Secondary | ICD-10-CM | POA: Insufficient documentation

## 2020-07-21 DIAGNOSIS — Z881 Allergy status to other antibiotic agents status: Secondary | ICD-10-CM | POA: Insufficient documentation

## 2020-07-21 DIAGNOSIS — Z7982 Long term (current) use of aspirin: Secondary | ICD-10-CM | POA: Diagnosis not present

## 2020-07-21 DIAGNOSIS — I2510 Atherosclerotic heart disease of native coronary artery without angina pectoris: Secondary | ICD-10-CM | POA: Insufficient documentation

## 2020-07-21 DIAGNOSIS — N400 Enlarged prostate without lower urinary tract symptoms: Secondary | ICD-10-CM | POA: Diagnosis not present

## 2020-07-21 DIAGNOSIS — I10 Essential (primary) hypertension: Secondary | ICD-10-CM

## 2020-07-21 DIAGNOSIS — Z8616 Personal history of COVID-19: Secondary | ICD-10-CM | POA: Insufficient documentation

## 2020-07-21 DIAGNOSIS — J439 Emphysema, unspecified: Secondary | ICD-10-CM | POA: Diagnosis not present

## 2020-07-21 DIAGNOSIS — I110 Hypertensive heart disease with heart failure: Secondary | ICD-10-CM | POA: Diagnosis not present

## 2020-07-21 DIAGNOSIS — Z79899 Other long term (current) drug therapy: Secondary | ICD-10-CM | POA: Insufficient documentation

## 2020-07-21 DIAGNOSIS — I5020 Unspecified systolic (congestive) heart failure: Secondary | ICD-10-CM

## 2020-07-21 NOTE — Patient Instructions (Addendum)
Continue weighing daily and call for an overnight weight gain of > 2 pounds or a weekly weight gain of >5 pounds.   Call us in the future if you'd like to schedule another appointment 

## 2020-07-21 NOTE — Progress Notes (Signed)
[facility] - HEART FAILURE CLINIC - PHARMACIST COUNSELING NOTE  Guideline-Directed Medical Therapy/Evidence Based Medicine  ACE/ARB/ARNI: N/A Beta Blocker: Metoprolol succinate 12.5 mg daily  Aldosterone Antagonist:  N/A Diuretic: Torsemide 20 mg daily  SGLT2i:  N/A  Adherence Assessment  Do you ever forget to take your medication? [] Yes [x] No  Do you ever skip doses due to side effects? [] Yes [x] No  Do you have trouble affording your medicines? [] Yes [x] No  Are you ever unable to pick up your medication due to transportation difficulties? [] Yes [x] No  Do you ever stop taking your medications because you don't believe they are helping? [] Yes [x] No  Do you check your weight daily? [x] Yes [] No   Adherence strategy: Pt resides at [facility]. Pt's medications are in bubble packs aside from aspirin, Mucinex, and torsemide.   Barriers to obtaining medications: N/A  Vital signs: HR 62, BP 126/60, weight (pounds) 160.6 ECHO: Date 03/13/2020, EF 30-35%, notes LV mild-moderately dilated  BMP Latest Ref Rng & Units 06/19/2020 06/11/2020 06/10/2020  Glucose 70 - 99 mg/dL ) ) )  BUN 8 - 23 mg/dL ) ) )  Creatinine 0.61 - 1.24 mg/dL [facility] 05/11/2020  Sodium 135 - 145 mmol/L 134(L) 136 136  Potassium 3.5 - 5.1 mmol/L 3.2(L) 3.9 4.0  Chloride 98 - 111 mmol/L 92(L) 97(L) 100  CO2 22 - 32 mmol/L 33(H) 30 27  Calcium 8.9 - 10.3 mg/dL 9.0 9.5 9.2    Past Medical History:  Diagnosis Date   BPH (benign prostatic hyperplasia)    CHF (congestive heart failure) (HCC)    Coronary artery disease    Hypertension    Myocardial infarction [facility])     ASSESSMENT 86 year old male who presents to the HF clinic for a follow up visit. Pt denies any episodes/symptoms of hypotension, but is complaining of some shortness of breath when he walks about a quarter of a mile. Pt otherwise does not have any complaints at this time.   Recent ED Visit (past 6 months):  Date - 06/06/2020, [facility] - acute on chronic CHF  PLAN CHF/HTN -continue metoprolol succinate 12.5 mg daily and torsemide 20 mg daily -continue daily weight checks  -Addition of GDMT limited by BP; reassess at later visit   CAD/hx of MI -continue aspirin 81 mg daily, lovastatin 20 mg, and metoprolol as above -consider switching to high-intensity statin therapy   Angina -continue isosorbide mononitrate 30 mg daily   Hypothyroidism -continue levothyroxine 50 mcg daily  BPH -continue tamsulosin 0.4 mg daily    Time spent: 10 minutes  324(M, PharmD Pharmacy Resident  07/21/2020 9:43 AM     Current Outpatient Medications:    albuterol (VENTOLIN HFA) 108 (90 Base) MCG/ACT inhaler, Inhale 1 puff into the lungs every 4 (four) hours., Disp: , Rfl:    aspirin EC 81 MG tablet, Take 1 tablet (81 mg total) by mouth daily. Swallow whole., Disp: 90 tablet, Rfl: 3   donepezil (ARICEPT) 5 MG tablet, Take 5 mg by mouth at bedtime., Disp: , Rfl:    guaiFENesin (MUCINEX) 600 MG 12 hr tablet, Take 600 mg by mouth 2 (two) times daily., Disp: , Rfl:    isosorbide mononitrate (IMDUR) 30 MG 24 hr tablet, Take 1 tablet (30 mg total) by mouth daily., Disp: 30 tablet, Rfl: 5   levothyroxine (SYNTHROID) 50 MCG tablet, Take 50 mcg by mouth every morning., Disp: , Rfl:    lovastatin (MEVACOR) 20 MG tablet, Take 20 mg  by mouth daily with supper., Disp: , Rfl:    metoprolol succinate (TOPROL-XL) 25 MG 24 hr tablet, Take 0.5 tablets (12.5 mg total) by mouth daily., Disp: 30 tablet, Rfl:    omeprazole (PRILOSEC) 20 MG capsule, Take 20 mg by mouth daily., Disp: , Rfl:    sertraline (ZOLOFT) 50 MG tablet, Take 100 mg by mouth daily., Disp: , Rfl:    tamsulosin (FLOMAX) 0.4 MG CAPS capsule, Take 0.4 mg by mouth daily., Disp: , Rfl:    torsemide (DEMADEX) 20 MG tablet, Take 20 mg by mouth daily., Disp: , Rfl:    traZODone (DESYREL) 50 MG tablet, Take 50 mg by mouth at bedtime., Disp: , Rfl:    DRUGS  TO CAUTION IN HEART FAILURE  Drug or Class Mechanism  Analgesics NSAIDs COX-2 inhibitors Glucocorticoids  Sodium and water retention, increased systemic vascular resistance, decreased response to diuretics   Diabetes Medications Metformin Thiazolidinediones Rosiglitazone (Avandia) Pioglitazone (Actos) DPP4 Inhibitors Saxagliptin (Onglyza) Sitagliptin (Januvia)   Lactic acidosis Possible calcium channel blockade   Unknown  Antiarrhythmics Class I  Flecainide Disopyramide Class III Sotalol Other Dronedarone  Negative inotrope, proarrhythmic   Proarrhythmic, beta blockade  Negative inotrope  Antihypertensives Alpha Blockers Doxazosin Calcium Channel Blockers Diltiazem Verapamil Nifedipine Central Alpha Adrenergics Moxonidine Peripheral Vasodilators Minoxidil  Increases renin and aldosterone  Negative inotrope    Possible sympathetic withdrawal  Unknown  Anti-infective Itraconazole Amphotericin B  Negative inotrope Unknown  Hematologic Anagrelide Cilostazol   Possible inhibition of PD IV Inhibition of PD III causing arrhythmias  Neurologic/Psychiatric Stimulants Anti-Seizure Drugs Carbamazepine Pregabalin Antidepressants Tricyclics Citalopram Parkinsons Bromocriptine Pergolide Pramipexole Antipsychotics Clozapine Antimigraine Ergotamine Methysergide Appetite suppressants Bipolar Lithium  Peripheral alpha and beta agonist activity  Negative inotrope and chronotrope Calcium channel blockade  Negative inotrope, proarrhythmic Dose-dependent QT prolongation  Excessive serotonin activity/valvular damage Excessive serotonin activity/valvular damage Unknown  IgE mediated hypersensitivy, calcium channel blockade  Excessive serotonin activity/valvular damage Excessive serotonin activity/valvular damage Valvular damage  Direct myofibrillar degeneration, adrenergic stimulation  Antimalarials Chloroquine Hydroxychloroquine  Intracellular inhibition of lysosomal enzymes  Urologic Agents Alpha Blockers Doxazosin Prazosin Tamsulosin Terazosin  Increased renin and aldosterone  Adapted from Page Williemae Natter, et al. "Drugs That May Cause or Exacerbate Heart Failure: A Scientific Statement from the American Heart  Association." Circulation 2016; 134:e32-e69. DOI: 10.1161/CIR.0000000000000426   MEDICATION ADHERENCES TIPS AND STRATEGIES Taking medication as prescribed improves patient outcomes in heart failure (reduces hospitalizations, improves symptoms, increases survival) Side effects of medications can be managed by decreasing doses, switching agents, stopping drugs, or adding additional therapy. Please let someone in the Heart Failure Clinic know if you have having bothersome side effects so we can modify your regimen. Do not alter your medication regimen without talking to [place].  Medication reminders can help patients remember to take drugs on time. If you are missing or forgetting doses you can try linking behaviors, using pill boxes, or an electronic reminder like an alarm on your phone or an app. Some people can also get automated phone calls as medication reminders.

## 2020-07-22 ENCOUNTER — Other Ambulatory Visit: Payer: Self-pay | Admitting: *Deleted

## 2020-09-09 ENCOUNTER — Ambulatory Visit
Admission: RE | Admit: 2020-09-09 | Discharge: 2020-09-09 | Disposition: A | Discharge status: Home / Self Care | Payer: Medicare Other | Source: Ambulatory Visit | Attending: Oncology | Admitting: Oncology

## 2020-09-09 ENCOUNTER — Other Ambulatory Visit: Payer: Self-pay

## 2020-09-09 DIAGNOSIS — U071 COVID-19: Secondary | ICD-10-CM | POA: Diagnosis not present

## 2020-09-09 DIAGNOSIS — I517 Cardiomegaly: Secondary | ICD-10-CM | POA: Diagnosis not present

## 2020-09-09 DIAGNOSIS — I700 Atherosclerosis of aorta: Secondary | ICD-10-CM | POA: Insufficient documentation

## 2020-09-09 DIAGNOSIS — I2510 Atherosclerotic heart disease of native coronary artery without angina pectoris: Secondary | ICD-10-CM | POA: Insufficient documentation

## 2020-09-09 DIAGNOSIS — U099 Post covid-19 condition, unspecified: Secondary | ICD-10-CM | POA: Insufficient documentation

## 2020-09-09 DIAGNOSIS — J1282 Pneumonia due to coronavirus disease 2019: Secondary | ICD-10-CM | POA: Insufficient documentation

## 2020-09-09 DIAGNOSIS — N200 Calculus of kidney: Secondary | ICD-10-CM | POA: Insufficient documentation

## 2020-09-09 DIAGNOSIS — J948 Other specified pleural conditions: Secondary | ICD-10-CM

## 2020-09-09 LAB — GLUCOSE, CAPILLARY: Glucose-Capillary: 111 mg/dL — ABNORMAL HIGH (ref 70–99)

## 2020-09-09 IMAGING — CT NM PET TUM IMG INITIAL (PI) SKULL BASE T - THIGH
1 of 10 series · 1 of 25 positions shown · non-contrast
Comparison: CT chest 06/06/2020.

CLINICAL DATA: Initial treatment strategy for lung nodule.

EXAM:
NUCLEAR MEDICINE PET SKULL BASE TO THIGH
TECHNIQUE: 8.9 mCi F-18 FDG was injected intravenously. Full-ring PET imaging
was performed from the skull base to thigh after the radiotracer. CT
data was obtained and used for attenuation correction and anatomic
localization.
Fasting blood glucose: 111 mg/dl

[Series 3: ct wb 5.0 b30f · axial · 5.0mm · 0.98mm/px · 1 of 327 slices shown]
[im 327/327  brain]
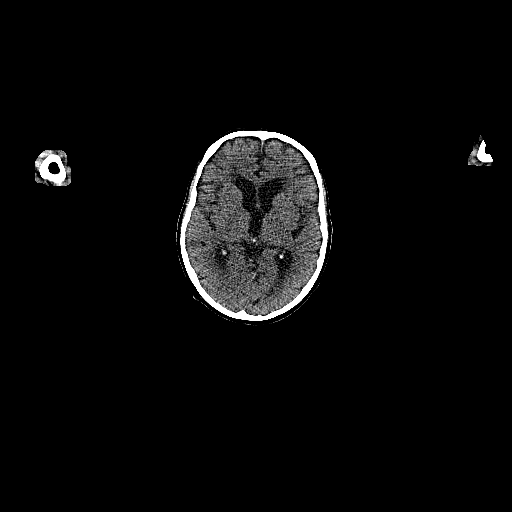

[1 of 25 positions shown; findings below may reference images not displayed]

FINDINGS: Mediastinal blood pool activity: SUV max

Liver activity: SUV max NA

NECK:

No abnormal hypermetabolism.

Incidental CT findings:

None.

CHEST:

No hypermetabolic mediastinal, hilar or axillary lymph nodes. No
hypermetabolic pulmonary nodules. Multiple calcified pleural plaques
bilaterally without associated hypermetabolism above blood pool.

Incidental CT findings:

Atherosclerotic calcification of the aorta, aortic valve and
coronary arteries. Heart is enlarged. No pericardial effusion.
Calcified mediastinal and hilar lymph nodes. Patchy bibasilar
coarsened pulmonary parenchymal ground-glass and bronchiectasis. No
pleural fluid.

ABDOMEN/PELVIS:

No abnormal hypermetabolism in the liver, adrenal glands, spleen or
pancreas. No hypermetabolic lymph nodes.

Incidental CT findings:

Liver and adrenal glands are unremarkable. Stones in the kidneys
bilaterally. Spleen, pancreas, stomach and bowel are grossly
unremarkable. Cholecystectomy. Infrarenal aorta measures 3.6 cm.
Right common iliac artery aneurysm, 2.2 cm. Bladder wall appears
slightly thickened. Prostate is slightly enlarged.

SKELETON:

No abnormal hypermetabolism.

Incidental CT findings:

Degenerative changes in the spine.
IMPRESSION: 1. No abnormal hypermetabolism in the neck, chest, abdomen or
pelvis.
2. Sequelae of [id number]-OW pneumonia in the lungs bilaterally.
3. Bilateral renal stones.
4. Aortic atherosclerosis (03O8Q-3D6.6). Recommend follow-up
ultrasound every 2 years. This recommendation follows ACR consensus
guidelines: White Paper of the ACR Incidental Findings Committee II
on Vascular Findings. [HOSPITAL] 4867; [DATE].
5. Asbestos related pleural disease.
6. Aortic atherosclerosis (03O8Q-3D6.6). Coronary artery
calcification.

## 2020-09-09 MED ORDER — FLUDEOXYGLUCOSE F - 18 (FDG) INJECTION
8.3000 | Freq: Once | INTRAVENOUS | Status: AC | PRN
  Administered 2020-09-09: 8.88 via INTRAVENOUS

## 2020-09-12 ENCOUNTER — Inpatient Hospital Stay: Payer: Medicare Other | Attending: Oncology | Admitting: Oncology

## 2020-09-12 ENCOUNTER — Encounter: Payer: Self-pay | Admitting: *Deleted

## 2020-09-12 ENCOUNTER — Other Ambulatory Visit: Payer: Self-pay

## 2020-09-12 VITALS — BP 138/68 | HR 80 | Temp 97.8°F | Resp 20 | Wt 158.8 lb

## 2020-09-12 DIAGNOSIS — Z7982 Long term (current) use of aspirin: Secondary | ICD-10-CM | POA: Insufficient documentation

## 2020-09-12 DIAGNOSIS — Z77090 Contact with and (suspected) exposure to asbestos: Secondary | ICD-10-CM | POA: Diagnosis not present

## 2020-09-12 DIAGNOSIS — R222 Localized swelling, mass and lump, trunk: Secondary | ICD-10-CM

## 2020-09-12 DIAGNOSIS — I2510 Atherosclerotic heart disease of native coronary artery without angina pectoris: Secondary | ICD-10-CM | POA: Insufficient documentation

## 2020-09-12 DIAGNOSIS — R918 Other nonspecific abnormal finding of lung field: Secondary | ICD-10-CM | POA: Diagnosis not present

## 2020-09-12 DIAGNOSIS — N400 Enlarged prostate without lower urinary tract symptoms: Secondary | ICD-10-CM | POA: Diagnosis not present

## 2020-09-12 DIAGNOSIS — Z79899 Other long term (current) drug therapy: Secondary | ICD-10-CM | POA: Insufficient documentation

## 2020-09-12 DIAGNOSIS — I110 Hypertensive heart disease with heart failure: Secondary | ICD-10-CM | POA: Diagnosis not present

## 2020-09-12 DIAGNOSIS — I509 Heart failure, unspecified: Secondary | ICD-10-CM | POA: Insufficient documentation

## 2020-09-12 DIAGNOSIS — I252 Old myocardial infarction: Secondary | ICD-10-CM | POA: Diagnosis not present

## 2020-09-14 NOTE — Progress Notes (Signed)
Hematology/Oncology Consult note [facility]  Telephone:([phone] [phone]  Patient Care Team: Housecalls, Doctors Making as PCP - General (Geriatric Medicine) Glory Buff, RN as Oncology Nurse Navigator   Name of the patient: Douglas Vance  [id number]  [birth year]   Date of visit: 09/14/20  Diagnosis-pleural nodules nonmalignant likely due to asbestos exposure  Chief complaint/ Reason for visit-discuss CT scan results and further management  Heme/Onc history:  Patient is a 86 year old male with a past medical history significant for smoking but he quit in 2000.  Prior to that he smoked 1 pack of cigarettes per day for over 40 years.  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  He was admitted to the ER in April 2022 for symptoms of acute hypoxic respiratory failure requiring 15 L of nonrebreather secondary to COVID-pneumonia complicated by aspiration.  He required BiPAP in the ICU and eventually recovered and has been off oxygen.  During his hospitalization patient underwent a CT angio chest which showed bilateral calcified pleural plaques with prior asbestos exposure.  Nodular soft tissue pleural masses along the left lateral chest wall measuring 2.4 x 1.2 cm and 0.7 x 1.8 cm.  Bilateral airspace opacities.  These pleural-based masses were reported to be focal scarring versus malignancy.  Age indeterminate T4 vertebral compression fracture.   PET CT scan in August 2022 showed no evidence of hypermetabolic activity in the neck chest abdomen or pelvis.  Sequelae of COVID-19 pneumonia in bilateral lungs.  Multiple calcified pleural plaque's bilaterally without associated hypermetabolism about blood pool.   Interval history-patient is here with his daughter today.  Ever since his COVID diagnosis although he has recovered he continues to have some exertional shortness of breath as well as nonproductive cough.  ECOG PS- 2 Pain  scale- 0   Review of systems- Review of Systems  Constitutional:  Positive for malaise/fatigue. Negative for chills, fever and weight loss.  HENT:  Negative for congestion, ear discharge and nosebleeds.   Eyes:  Negative for blurred vision.  Respiratory:  Positive for shortness of breath. Negative for cough, hemoptysis, sputum production and wheezing.   Cardiovascular:  Negative for chest pain, palpitations, orthopnea and claudication.  Gastrointestinal:  Negative for abdominal pain, blood in stool, constipation, diarrhea, heartburn, melena, nausea and vomiting.  Genitourinary:  Negative for dysuria, flank pain, frequency, hematuria and urgency.  Musculoskeletal:  Negative for back pain, joint pain and myalgias.  Skin:  Negative for rash.  Neurological:  Negative for dizziness, tingling, focal weakness, seizures, weakness and headaches.  Endo/Heme/Allergies:  Does not bruise/bleed easily.  Psychiatric/Behavioral:  Negative for depression and suicidal ideas. The patient does not have insomnia.    Allergies  Allergen Reactions   Cipro [Ciprofloxacin Hcl]      Past Medical History:  Diagnosis Date   BPH (benign prostatic hyperplasia)    CHF (congestive heart failure) (HCC)    Coronary artery disease    Hypertension    Myocardial infarction [facility])      Past Surgical History:  Procedure Laterality Date   CARDIAC CATHETERIZATION     VASECTOMY      Social History   Socioeconomic History   Marital status: Widowed    Spouse name: Not on file   Number of children: Not on file   Years of education: Not on file   Highest education level: Not on file  Occupational History   Not on file  Tobacco Use   Smoking status:  Former   Smokeless tobacco: Never  Building services engineer Use: Never used  Substance and Sexual Activity   Alcohol use: Not Currently   Drug use: Never   Sexual activity: Not on file  Other Topics Concern   Not on file  Social History Narrative   Not on file    Social Determinants of Health   Financial Resource Strain: Not on file  Food Insecurity: Not on file  Transportation Needs: Not on file  Physical Activity: Not on file  Stress: Not on file  Social Connections: Not on file  Intimate Partner Violence: Not on file    No family history on file.   Current Outpatient Medications:    albuterol (VENTOLIN HFA) 108 (90 Base) MCG/ACT inhaler, Inhale 1 puff into the lungs every 4 (four) hours., Disp: , Rfl:    amoxicillin-clavulanate (AUGMENTIN) 875-125 MG tablet, Take 1 tablet by mouth 2 (two) times daily., Disp: , Rfl:    aspirin EC 81 MG tablet, Take 1 tablet (81 mg total) by mouth daily. Swallow whole., Disp: 90 tablet, Rfl: 3   benzonatate (TESSALON) 200 MG capsule, Take 200 mg by mouth 3 (three) times daily as needed for cough., Disp: , Rfl:    donepezil (ARICEPT) 5 MG tablet, Take 5 mg by mouth at bedtime., Disp: , Rfl:    guaiFENesin (MUCINEX) 600 MG 12 hr tablet, Take 600 mg by mouth 2 (two) times daily., Disp: , Rfl:    levothyroxine (SYNTHROID) 50 MCG tablet, Take 50 mcg by mouth every morning., Disp: , Rfl:    loratadine (CLARITIN) 10 MG tablet, Take 10 mg by mouth daily., Disp: , Rfl:    lovastatin (MEVACOR) 20 MG tablet, Take 20 mg by mouth daily with supper., Disp: , Rfl:    metoprolol succinate (TOPROL-XL) 25 MG 24 hr tablet, Take 0.5 tablets (12.5 mg total) by mouth daily., Disp: 30 tablet, Rfl:    omeprazole (PRILOSEC) 20 MG capsule, Take 20 mg by mouth daily., Disp: , Rfl:    predniSONE (DELTASONE) 20 MG tablet, Take 20 mg by mouth daily with breakfast., Disp: , Rfl:    sertraline (ZOLOFT) 50 MG tablet, Take 100 mg by mouth daily., Disp: , Rfl:    tamsulosin (FLOMAX) 0.4 MG CAPS capsule, Take 0.4 mg by mouth daily., Disp: , Rfl:    torsemide (DEMADEX) 20 MG tablet, Take 20 mg by mouth daily., Disp: , Rfl:    traZODone (DESYREL) 50 MG tablet, Take 50 mg by mouth at bedtime., Disp: , Rfl:    isosorbide mononitrate (IMDUR) 30  MG 24 hr tablet, Take 1 tablet (30 mg total) by mouth daily., Disp: 30 tablet, Rfl: 5  Physical exam:  Vitals:   09/12/20 1319  BP: 138/68  Pulse: 80  Resp: 20  Temp: 97.8 F (36.6 C)  TempSrc: Tympanic  SpO2: 95%  Weight: 158 lb 12.8 oz (72 kg)   Physical Exam Constitutional:      General: He is not in acute distress. Cardiovascular:     Rate and Rhythm: Normal rate and regular rhythm.     Heart sounds: Normal heart sounds.  Pulmonary:     Effort: Pulmonary effort is normal.     Breath sounds: Normal breath sounds.  Skin:    General: Skin is warm and dry.  Neurological:     Mental Status: He is alert and oriented to person, place, and time.     CMP Latest Ref Rng & Units 06/19/2020  Glucose  70 - 99 mg/dL 295(A122(H)  BUN 8 - 23 mg/dL 21(H31(H)  Creatinine 0.860.61 - 1.24 mg/dL 5.780.93  Sodium 469135 - 629145 mmol/L 134(L)  Potassium 3.5 - 5.1 mmol/L 3.2(L)  Chloride 98 - 111 mmol/L 92(L)  CO2 22 - 32 mmol/L 33(H)  Calcium 8.9 - 10.3 mg/dL 9.0  Total Protein 6.5 - 8.1 g/dL -  Total Bilirubin 0.3 - 1.2 mg/dL -  Alkaline Phos 38 - 528126 U/L -  AST 15 - 41 U/L -  ALT 0 - 44 U/L -   CBC Latest Ref Rng & Units 06/08/2020  WBC 4.0 - 10.5 K/uL 9.4  Hemoglobin 13.0 - 17.0 g/dL 41.313.7  Hematocrit 24.439.0 - 52.0 % 40.7  Platelets 150 - 400 K/uL 182    No images are attached to the encounter.  NM PET Image Initial (PI) Skull Base To Thigh  Result Date: 09/10/2020 CLINICAL DATA:  Initial treatment strategy for lung nodule. EXAM: NUCLEAR MEDICINE PET SKULL BASE TO THIGH TECHNIQUE: 8.9 mCi F-18 FDG was injected intravenously. Full-ring PET imaging was performed from the skull base to thigh after the radiotracer. CT data was obtained and used for attenuation correction and anatomic localization. Fasting blood glucose: 111 mg/dl COMPARISON:  CT chest 01/02/725304/29/2022. FINDINGS: Mediastinal blood pool activity: SUV max 2.3 Liver activity: SUV max NA NECK: No abnormal hypermetabolism. Incidental CT findings: None.  CHEST: No hypermetabolic mediastinal, hilar or axillary lymph nodes. No hypermetabolic pulmonary nodules. Multiple calcified pleural plaques bilaterally without associated hypermetabolism above blood pool. Incidental CT findings: Atherosclerotic calcification of the aorta, aortic valve and coronary arteries. Heart is enlarged. No pericardial effusion. Calcified mediastinal and hilar lymph nodes. Patchy bibasilar coarsened pulmonary parenchymal ground-glass and bronchiectasis. No pleural fluid. ABDOMEN/PELVIS: No abnormal hypermetabolism in the liver, adrenal glands, spleen or pancreas. No hypermetabolic lymph nodes. Incidental CT findings: Liver and adrenal glands are unremarkable. Stones in the kidneys bilaterally. Spleen, pancreas, stomach and bowel are grossly unremarkable. Cholecystectomy. Infrarenal aorta measures 3.6 cm. Right common iliac artery aneurysm, 2.2 cm. Bladder wall appears slightly thickened. Prostate is slightly enlarged. SKELETON: No abnormal hypermetabolism. Incidental CT findings: Degenerative changes in the spine. IMPRESSION: 1. No abnormal hypermetabolism in the neck, chest, abdomen or pelvis. 2. Sequelae of COVID-19 pneumonia in the lungs bilaterally. 3. Bilateral renal stones. 4. Aortic atherosclerosis (ICD10-I70.0). Recommend follow-up ultrasound every 2 years. This recommendation follows ACR consensus guidelines: White Paper of the ACR Incidental Findings Committee II on Vascular Findings. J Am Coll Radiol 2013; 10:789-794. 5. Asbestos related pleural disease. 6. Aortic atherosclerosis (ICD10-I70.0). Coronary artery calcification. Electronically Signed   By: Leanna BattlesMelinda  Blietz M.D.   On: 09/10/2020 09:55     Assessment and plan- Patient is a 86 y.o. male referred for pleural nodules  Patient had a CT angio chest in April 2022 when he was admitted for his COVID-pneumonia and was found to have bilateral calcified pleural plaques but also possible nodular pleural masses along the left  chest wall measuring 2.4 x 1.2 and 1.7 x 1.8 cm respectively.  It was unclear if this was concerning for malignancy and after discussion at tumor board a repeat PET scan in 3 months was recommended.  PET scan from August 2022 does not show any evidence of hypermetabolic activity in the neck chest abdomen and pelvis.  He does have bilateral pleural plaques likely secondary to prior asbestos exposure but none of these are hypermetabolic and concerning for malignancy.  This was also discussed at tumor board the day prior.  I  discussed all these findings with the patient and his daughter.  He does not require any surveillance imaging for these pleural plaques.  Patient does seem to have ongoing symptoms of long COVID with ongoing exertional shortness of breath as well as cough which can be followed by his primary care doctor.  If his respiratory symptoms worsen he needs to be referred to pulmonary at that time.  No follow-up required with   Visit Diagnosis 1. Pleural nodule      Dr. Owens Shark, MD, MPH [facility] at [facility] [phone] 09/14/2020 2:54 PM

## 2021-06-05 ENCOUNTER — Encounter: Payer: Self-pay | Admitting: Cardiology

## 2021-06-05 ENCOUNTER — Ambulatory Visit (INDEPENDENT_AMBULATORY_CARE_PROVIDER_SITE_OTHER): Payer: Medicare Other | Admitting: Cardiology

## 2021-06-05 VITALS — BP 110/50 | HR 56 | Ht 67.0 in | Wt 162.0 lb

## 2021-06-05 DIAGNOSIS — R0902 Hypoxemia: Secondary | ICD-10-CM | POA: Diagnosis not present

## 2021-06-05 DIAGNOSIS — I2510 Atherosclerotic heart disease of native coronary artery without angina pectoris: Secondary | ICD-10-CM | POA: Diagnosis not present

## 2021-06-05 DIAGNOSIS — I5020 Unspecified systolic (congestive) heart failure: Secondary | ICD-10-CM | POA: Diagnosis not present

## 2021-06-05 DIAGNOSIS — I10 Essential (primary) hypertension: Secondary | ICD-10-CM

## 2021-06-05 NOTE — Patient Instructions (Signed)
Medication Instructions:  ? ?Your physician recommends that you continue on your current medications as directed. Please refer to the Current Medication list given to you today. ? ? ?*If you need a refill on your cardiac medications before your next appointment, please call your pharmacy* ? ? ?Lab Work: ?None ordered ?If you have labs (blood work) drawn today and your tests are completely normal, you will receive your results only by: ?MyChart Message (if you have MyChart) OR ?A paper copy in the mail ?If you have any lab test that is abnormal or we need to change your treatment, we will call you to review the results. ? ? ?Testing/Procedures: ? ?Your physician has requested that you have an echocardiogram in 3 months. Echocardiography is a painless test that uses sound waves to create images of your heart. It provides your doctor with information about the size and shape of your heart and how well your heart?s chambers and valves are working. This procedure takes approximately one hour. There are no restrictions for this procedure.  ? ? ?Follow-Up: ?At [facility], you and your health needs are our priority.  As part of our continuing mission to provide you with exceptional heart care, we have created designated Provider Care Teams.  These Care Teams include your primary Cardiologist (physician) and Advanced Practice Providers (APPs -  Physician Assistants and Nurse Practitioners) who all work together to provide you with the care you need, when you need it. ? ?We recommend signing up for the patient portal called "MyChart".  Sign up information is provided on this After Visit Summary.  MyChart is used to connect with patients for Virtual Visits (Telemedicine).  Patients are able to view lab/test results, encounter notes, upcoming appointments, etc.  Non-urgent messages can be sent to your provider as well.   ?To learn more about what you can do with MyChart, go to [email or web address].   ? ?Your next  appointment:   ?6 month(s) ? ?The format for your next appointment:   ?In Person ? ?Provider:   ?You may see Debbe Odea, MD or one of the following Advanced Practice Providers on your designated Care Team:   ?Nicolasa Ducking, NP ?Eula Listen, PA-C ?Cadence Fransico Michael, PA-C  ? ? ?Other Instructions ? ? ?Important Information About [facility] ? ? ? ? ? ? ?

## 2021-06-05 NOTE — Progress Notes (Signed)
?Cardiology Office Note:   ? ?Date:  06/05/2021  ? ?ID:  Douglas Vance, DOB Dec 14, 1934, MRN [id number] ? ?PCP:  Pcp, No  ?[facility] Cardiologist:  None  ?[facility] Electrophysiologist:  None  ? ?Referring MD: Housecalls, Doctors Mak*  ? ?Chief Complaint  ?Patient presents with  ? Other  ?  6 month follow up -- Patient c.o SOB and numbness of fingers. Meds reviewed verbally with patient.   ? ? ?History of Present Illness:   ? ?Douglas Vance is a 87 y.o. male with a hx of hypertension, ischemic cardiomyopathy, MI/CAD (s/p PCI x 9), emphysema, former smoker x40+ years who presents for follow-up. ? ?Being seen for cardiomyopathy, CAD.  Patient recently went to [place] for a trip.  Prior to his trip, was admitted to [facility] for shortness of breath and hypoxia with exertion.  Oxygen was recommended.  Still working on receiving oxygenation.  He denies edema, denies weight gain.  Has minimal chest pain which is chronic.  Plans to follow-up with pulmonary medicine regarding emphysema treatment and oxygen needs.  Takes all medications as prescribed. ? ? ?Prior notes ?Echo 03/13/2020, EF 30 to 35% ?States having a history of MI, first occurrence in 2000.  He was seen in [facility] where stent was placed.  Over the past several years, he has had multiple PCI's, totaling 9.  Last PCI was 3 years ago by former cardiologist.  He moved to the area to be close to daughter after he lost his wife in August 2021. ? ?patient has a history of GI bleeds, EGD and colonoscopy only revealed gastritis.  Has a history of significant nosebleeds, causing patient to stop Plavix. ?. ? ? ?Past Medical History:  ?Diagnosis Date  ? BPH (benign prostatic hyperplasia)   ? CHF (congestive heart failure) (HCC)   ? Coronary artery disease   ? Hypertension   ? Myocardial infarction [facility])   ? ? ?Past Surgical History:  ?Procedure Laterality Date  ? CARDIAC CATHETERIZATION    ? VASECTOMY    ? ? ?Current Medications: ?Current Meds   ?Medication Sig  ? albuterol (VENTOLIN HFA) 108 (90 Base) MCG/ACT inhaler Inhale 1 puff into the lungs every 4 (four) hours.  ? atorvastatin (LIPITOR) 40 MG tablet Take 40 mg by mouth daily.  ? donepezil (ARICEPT) 5 MG tablet Take 5 mg by mouth at bedtime.  ? furosemide (LASIX) 20 MG tablet Take 20 mg by mouth daily.  ? isosorbide mononitrate (IMDUR) 30 MG 24 hr tablet Take 1 tablet (30 mg total) by mouth daily.  ? levothyroxine (SYNTHROID) 50 MCG tablet Take 50 mcg by mouth every morning.  ? loratadine (CLARITIN) 10 MG tablet Take 10 mg by mouth daily.  ? losartan (COZAAR) 25 MG tablet Take 12.5 mg by mouth daily.  ? metoprolol succinate (TOPROL-XL) 25 MG 24 hr tablet Take 0.5 tablets (12.5 mg total) by mouth daily.  ? mirtazapine (REMERON) 15 MG tablet Take 7.5 mg by mouth at bedtime.  ? omeprazole (PRILOSEC) 20 MG capsule Take 20 mg by mouth daily.  ? tamsulosin (FLOMAX) 0.4 MG CAPS capsule Take 0.4 mg by mouth daily.  ? traZODone (DESYREL) 50 MG tablet Take 50 mg by mouth at bedtime.  ?  ? ?Allergies:   Cipro [ciprofloxacin hcl]  ? ?Social History  ? ?Socioeconomic History  ? Marital status: Widowed  ?  Spouse name: Not on file  ? Number of children: Not on file  ? Years of education: Not  on file  ? Highest education level: Not on file  ?Occupational History  ? Not on file  ?Tobacco Use  ? Smoking status: Former  ? Smokeless tobacco: Never  ?Vaping Use  ? Vaping Use: Never used  ?Substance and Sexual Activity  ? Alcohol use: Not Currently  ? Drug use: Never  ? Sexual activity: Not on file  ?Other Topics Concern  ? Not on file  ?Social History Narrative  ? Not on file  ? ?Social Determinants of Health  ? ?Financial Resource Strain: Not on file  ?Food Insecurity: Not on file  ?Transportation Needs: Not on file  ?Physical Activity: Not on file  ?Stress: Not on file  ?Social Connections: Not on file  ?  ? ?Family History: ?The patient's family history is not on file. ? ?ROS:   ?Please see the history of present  illness.    ? All other systems reviewed and are negative. ? ?EKGs/Labs/Other Studies Reviewed:   ? ?The following studies were reviewed today: ? ? ?EKG:  EKG id ordered today.  EKG shows sinus bradycardia, left bundle branch block, first-degree AV block. ? ?Recent Labs: ?06/06/2020: B Natriuretic Peptide 1,222.8 ?06/08/2020: Hemoglobin 13.7; Platelets 182 ?06/10/2020: Magnesium 1.8 ?06/11/2020: ALT 35 ?06/19/2020: BUN 31; Creatinine, Ser 0.93; Potassium 3.2; Sodium 134  ?Recent Lipid Panel ?   ?Component Value Date/Time  ? CHOL 130 06/06/2020 1845  ? TRIG 54 06/06/2020 1845  ? HDL 56 06/06/2020 1845  ? CHOLHDL 2.3 06/06/2020 1845  ? VLDL 11 06/06/2020 1845  ? LDLCALC 63 06/06/2020 1845  ? ? ? ?Risk Assessment/Calculations:   ? ? ?Physical Exam:   ? ?VS:  BP (!) 110/50 (BP Location: Left Arm, Patient Position: Sitting, Cuff Size: Normal)   Pulse (!) 56   Ht 5\' 7"  (1.702 m)   Wt 162 lb (73.5 kg)   BMI 25.37 kg/m?    ? ?Wt Readings from Last 3 Encounters:  ?06/05/21 162 lb (73.5 kg)  ?09/12/20 158 lb 12.8 oz (72 kg)  ?07/21/20 160 lb 6 oz (72.7 kg)  ?  ? ?GEN:  Well nourished, well developed in no acute distress ?HEENT: Normal ?NECK: No JVD; No carotid bruits ?LYMPHATICS: No lymphadenopathy ?CARDIAC: RRR, 2/6 systolic murmur ?RESPIRATORY: Clear bilaterally, respiratory effort ?ABDOMEN: Soft, non-tender, non-distended ?MUSCULOSKELETAL:  No edema; No deformity  ?SKIN: Warm and dry ?NEUROLOGIC:  Alert and oriented x 3 ?PSYCHIATRIC:  Normal affect  ? ?ASSESSMENT:   ? ?1. Coronary artery disease involving native coronary artery of native heart, unspecified whether angina present   ?2. Primary hypertension   ?3. HFrEF (heart failure with reduced ejection fraction) (HCC)   ?4. Hypoxia   ? ? ?PLAN:   ? ?In order of problems listed above: ? ?History of CAD/PCI x 9.  Stable angina, minimal., tolerating Imdur well.  aspirin 81 mg daily, continue Imdur 30, Toprol-XL 12.5mg  qd. ?Hypertension, BP is low normal.  Continue on losartan  12.5 mg daily, Toprol-XL to 12.5 mg daily.  Continue Imdur for antianginal benefit. ?HFrEF, EF 35% likely ischemic in origin.  Low blood pressures preventing titration of CHF medis  He is euvolemic. continue Toprol-XL 12.5 mg daily, losartan 12.5 mg daily, torsemide 20mg  qd.  Repeat echo for EF eval. ?Shortness of breath, hypoxia with exertion.  History of emphysema.  Advised patient to establish care with pulmonary medicine, was currently referral but patient wants to establish care at [facility] which is close to where he lives, I think this is reasonable. ? ?  Follow-up in 6 months ? ? ?Medication Adjustments/Labs and Tests Ordered: ?Current medicines are reviewed at length with the patient today.  Concerns regarding medicines are outlined above.  ?Orders Placed This Encounter  ?Procedures  ? EKG 12-Lead  ? ECHOCARDIOGRAM COMPLETE  ? ?No orders of the defined types were placed in this encounter. ? ? ?Patient Instructions  ?Medication Instructions:  ? ?Your physician recommends that you continue on your current medications as directed. Please refer to the Current Medication list given to you today. ? ? ?*If you need a refill on your cardiac medications before your next appointment, please call your pharmacy* ? ? ?Lab Work: ?None ordered ?If you have labs (blood work) drawn today and your tests are completely normal, you will receive your results only by: ?MyChart Message (if you have MyChart) OR ?A paper copy in the mail ?If you have any lab test that is abnormal or we need to change your treatment, we will call you to review the results. ? ? ?Testing/Procedures: ? ?Your physician has requested that you have an echocardiogram in 3 months. Echocardiography is a painless test that uses sound waves to create images of your heart. It provides your doctor with information about the size and shape of your heart and how well your heart?s chambers and valves are working. This procedure takes approximately one hour.  There are no restrictions for this procedure.  ? ? ?Follow-Up: ?[facility], you and your health needs are our priority.  As part of our continuing mission to provide you with exceptional heart care, we have cre

## 2021-06-08 ENCOUNTER — Ambulatory Visit: Payer: Medicare Other | Admitting: Cardiology

## 2021-07-04 ENCOUNTER — Encounter: Payer: Self-pay | Admitting: Cardiology

## 2021-07-09 ENCOUNTER — Telehealth: Payer: Self-pay

## 2021-07-09 MED ORDER — OMEPRAZOLE 20 MG PO CPDR
20.0000 mg | DELAYED_RELEASE_CAPSULE | Freq: Every day | ORAL | 1 refills | Status: AC
Start: 2021-07-09 — End: ?

## 2021-07-09 MED ORDER — LOSARTAN POTASSIUM 25 MG PO TABS: 12.5000 mg | ORAL_TABLET | Freq: Every day | ORAL | 1 refills | Status: AC

## 2021-07-09 MED ORDER — METOPROLOL SUCCINATE ER 25 MG PO TB24: 12.5000 mg | ORAL_TABLET | Freq: Every day | ORAL | 1 refills | Status: AC

## 2021-07-09 MED ORDER — LORATADINE 10 MG PO TABS: 10.0000 mg | ORAL_TABLET | Freq: Every day | ORAL | 1 refills | Status: AC

## 2021-07-09 MED ORDER — ALBUTEROL SULFATE HFA 108 (90 BASE) MCG/ACT IN AERS: 1.0000 | INHALATION_SPRAY | RESPIRATORY_TRACT | 3 refills | Status: AC

## 2021-07-09 MED ORDER — TAMSULOSIN HCL 0.4 MG PO CAPS: 0.4000 mg | ORAL_CAPSULE | Freq: Every day | ORAL | 1 refills | Status: AC

## 2021-07-09 MED ORDER — MIRTAZAPINE 7.5 MG PO TABS: 7.5000 mg | ORAL_TABLET | Freq: Every day | ORAL | 1 refills | Status: AC

## 2021-07-09 MED ORDER — ATORVASTATIN CALCIUM 40 MG PO TABS
40.0000 mg | ORAL_TABLET | Freq: Every day | ORAL | 1 refills | Status: AC
Start: 2021-07-09 — End: ?

## 2021-07-09 MED ORDER — TRAZODONE HCL 50 MG PO TABS: 50.0000 mg | ORAL_TABLET | Freq: Every day | ORAL | 1 refills | Status: AC

## 2021-07-09 MED ORDER — ISOSORBIDE MONONITRATE ER 30 MG PO TB24: 30.0000 mg | ORAL_TABLET | Freq: Every day | ORAL | 1 refills | Status: AC

## 2021-07-09 MED ORDER — DONEPEZIL HCL 5 MG PO TABS: 5.0000 mg | ORAL_TABLET | Freq: Every day | ORAL | 1 refills | Status: AC

## 2021-07-09 MED ORDER — FUROSEMIDE 20 MG PO TABS
20.0000 mg | ORAL_TABLET | Freq: Every day | ORAL | 1 refills | Status: AC
Start: 2021-07-09 — End: ?

## 2021-07-09 MED ORDER — LEVOTHYROXINE SODIUM 50 MCG PO TABS: 50.0000 ug | ORAL_TABLET | Freq: Every morning | ORAL | 1 refills | Status: AC

## 2021-07-09 NOTE — Telephone Encounter (Signed)
Per Dr. Thereasa Solo MyChart response below, all prescriptions have been sent to [facility] for a 6 month supply.  Will refill prescriptions for 6 months. This should cover you until you see your new. primary care provider.   All non cardiac medications will have to be refilled by your primary care provider after that.   Thank you   Signed, Kate Sable, M.D. 07/08/21 [place], [place]

## 2021-09-16 ENCOUNTER — Telehealth: Payer: Self-pay | Admitting: Cardiology

## 2021-09-16 NOTE — Telephone Encounter (Signed)
Patient seen in the ED in [facility] today.  He is scheduled for an echo tomorrow in our office.   To Dr. Azucena Cecil to review.

## 2021-09-16 NOTE — Telephone Encounter (Signed)
Dr. Purvis Kilts  Pulmonary from [facility], called wanting to let Dr. Azucena Cecil he did some lab work on the patient and his BNP was 434 from a pervious 265. Patient had lower extremity edema, volume over load.  His lungs were clear.  He did not want to change any medication, he wanted to leave that to Dr. Azucena Cecil as patient is both on torsemide and lasix.  He said to call daughter to let her know what to do.   If any questions you can reach Dr. Alejandro Mulling at [phone]

## 2021-09-17 NOTE — Telephone Encounter (Signed)
Debbe Odea, MD  Sent: Thu September 17, 2021  5:15 PM  To: Jefferey Pica, RN; Picard-Tagnolli, Coleen, RN  Cc: Vida Rigger Div [facility] Triage          Message  Patient should be on Lasix 20 mg daily.  Continue Lasix as prescribed, will consider increasing Lasix if edema persist.  Schedule follow-up appointment after echocardiogram.

## 2021-09-18 ENCOUNTER — Other Ambulatory Visit: Payer: Medicare Other

## 2021-09-18 NOTE — Telephone Encounter (Addendum)
Done . Faxed to confirmed fax [phone] for Dr. Joneen Roach at [facility]

## 2021-09-18 NOTE — Telephone Encounter (Signed)
Spoke with pt's daughter, echo was cancelled d/t her father not doing well.  Also pt has all other providers at [facility] and daughter is changing pt's cardiologist to [facility] and has an appointment with Dr. Joneen Roach.  Daughter requests that we send all pt's cardiology records to Dr. Payton Spark office.

## 2022-07-10 DEATH — deceased
